# Patient Record
Sex: Female | Born: 1980 | Hispanic: No | Marital: Married | Smoking: Never smoker
Health system: Southern US, Community
[De-identification: ages and names within clinical notes are randomized; demographics above are authoritative.]

## PROBLEM LIST (undated history)

## (undated) DIAGNOSIS — B019 Varicella without complication: Secondary | ICD-10-CM

## (undated) HISTORY — DX: Varicella without complication: B01.9

## (undated) HISTORY — PX: WISDOM TOOTH EXTRACTION: SHX21

## (undated) HISTORY — PX: TONSILLECTOMY: SHX5217

---

## 2005-03-08 ENCOUNTER — Other Ambulatory Visit: Admission: RE | Admit: 2005-03-08 | Discharge: 2005-03-08 | Payer: Self-pay | Admitting: Obstetrics and Gynecology

## 2006-07-02 ENCOUNTER — Other Ambulatory Visit: Admission: RE | Admit: 2006-07-02 | Discharge: 2006-07-02 | Payer: Self-pay | Admitting: Obstetrics and Gynecology

## 2016-02-02 ENCOUNTER — Encounter: Payer: Self-pay | Admitting: Family Medicine

## 2016-02-02 ENCOUNTER — Ambulatory Visit (INDEPENDENT_AMBULATORY_CARE_PROVIDER_SITE_OTHER): Payer: BLUE CROSS/BLUE SHIELD | Admitting: Family Medicine

## 2016-02-02 VITALS — BP 116/79 | HR 74 | Temp 98.5°F | Ht <= 58 in | Wt 103.8 lb

## 2016-02-02 DIAGNOSIS — Z6379 Other stressful life events affecting family and household: Secondary | ICD-10-CM | POA: Diagnosis not present

## 2016-02-02 DIAGNOSIS — Z0000 Encounter for general adult medical examination without abnormal findings: Secondary | ICD-10-CM

## 2016-02-02 DIAGNOSIS — Z130 Encounter for screening for diseases of the blood and blood-forming organs and certain disorders involving the immune mechanism: Secondary | ICD-10-CM

## 2016-02-02 DIAGNOSIS — D509 Iron deficiency anemia, unspecified: Secondary | ICD-10-CM

## 2016-02-02 DIAGNOSIS — Z131 Encounter for screening for diabetes mellitus: Secondary | ICD-10-CM

## 2016-02-02 DIAGNOSIS — Z1329 Encounter for screening for other suspected endocrine disorder: Secondary | ICD-10-CM | POA: Diagnosis not present

## 2016-02-02 DIAGNOSIS — R6252 Short stature (child): Secondary | ICD-10-CM

## 2016-02-02 DIAGNOSIS — H9191 Unspecified hearing loss, right ear: Secondary | ICD-10-CM

## 2016-02-02 DIAGNOSIS — Z13220 Encounter for screening for lipoid disorders: Secondary | ICD-10-CM

## 2016-02-02 LAB — LIPID PANEL
CHOLESTEROL: 195 mg/dL (ref 0–200)
HDL: 36 mg/dL — ABNORMAL LOW (ref >39.00)
LDL Cholesterol: 142 mg/dL — ABNORMAL HIGH (ref 0–99)
NonHDL: 158.64
TRIGLYCERIDES: 81 mg/dL (ref 0.0–149.0)
Total CHOL/HDL Ratio: 5
VLDL: 16.2 mg/dL (ref 0.0–40.0)

## 2016-02-02 LAB — CBC
HEMATOCRIT: 33.5 % — AB (ref 36.0–46.0)
HEMOGLOBIN: 10.7 g/dL — AB (ref 12.0–15.0)
MCHC: 32 g/dL (ref 30.0–36.0)
MCV: 73.1 fl — ABNORMAL LOW (ref 78.0–100.0)
PLATELETS: 323 10*3/uL (ref 150.0–400.0)
RBC: 4.57 Mil/uL (ref 3.87–5.11)
RDW: 19.6 % — ABNORMAL HIGH (ref 11.5–15.5)
WBC: 9.4 10*3/uL (ref 4.0–10.5)

## 2016-02-02 LAB — COMPREHENSIVE METABOLIC PANEL
ALK PHOS: 43 U/L (ref 39–117)
ALT: 12 U/L (ref 0–35)
AST: 14 U/L (ref 0–37)
Albumin: 4 g/dL (ref 3.5–5.2)
BILIRUBIN TOTAL: 0.4 mg/dL (ref 0.2–1.2)
BUN: 15 mg/dL (ref 6–23)
CALCIUM: 9 mg/dL (ref 8.4–10.5)
CO2: 24 meq/L (ref 19–32)
Chloride: 106 mEq/L (ref 96–112)
Creatinine, Ser: 0.69 mg/dL (ref 0.40–1.20)
GFR: 103.89 mL/min (ref >60.00)
Glucose, Bld: 103 mg/dL — ABNORMAL HIGH (ref 70–99)
POTASSIUM: 3.8 meq/L (ref 3.5–5.1)
Sodium: 138 mEq/L (ref 135–145)
Total Protein: 7.1 g/dL (ref 6.0–8.3)

## 2016-02-02 LAB — HEMOGLOBIN A1C: Hgb A1c MFr Bld: 5.6 % (ref 4.6–6.5)

## 2016-02-02 LAB — TSH: TSH: 0.89 u[IU]/mL (ref 0.35–4.50)

## 2016-02-02 NOTE — Progress Notes (Addendum)
[facility] [address], [address] [place], [place] [address] [phone] [phone]  Date:  02/02/2016   Name:  Tara Lam   DOB:  Apr 12, 1980   MRN:  [id number]  PCP:  Abbe AmsterdamOPLAND,JESSICA, MD    Chief Complaint: Establish Care (Pt here to est care. )   History of Present Illness:  Tara Lam is a 36 y.o. very pleasant female patient who presents with the following:  Here today as a new patient to establish care  She is a pt at [facility]- sees Dr. Silvestre Gunner'Keeffe there, last seen there in march of 2017.  She has generally been in good health She has 2 children- they are 7yo girl and 375 yo boy.   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  Would like to do today  She tries to eat as well as she can, but admits that she does not really follow a healthy diet or exercise.  So far she has been able to keep a steady weight  She is fasting today- she did have a regular mountain dew this am but nothing to eat so far.  She does not drink coffee  Both of her kids are at the same school which has helped to simplify her life In her free time she enjoys watching basketball on TV and other TV shows.    She is on OCP- no other meds.  She is s/p BTL but uses the pills to regulate her menses  She separated from her husband about 8 months ago- this was her decision. 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   Past Medical History:  Diagnosis Date  . Chicken pox     Past Surgical History:   Procedure Laterality Date  . CESAREAN SECTION  04/03/2010  . TONSILLECTOMY    . WISDOM TOOTH EXTRACTION      Social History  Substance Use Topics  . Smoking status: Never Smoker  . Smokeless tobacco: Never Used  . Alcohol use Yes     Comment: social drinker    Family History  Problem Relation Age of Onset  . Cancer Maternal Grandmother   . Diabetes Maternal Grandmother   . Bladder Cancer Maternal Grandfather   . Breast cancer Paternal Grandmother   . Cancer Paternal Grandfather     No Known Allergies  Medication list has been reviewed and updated.  No current outpatient prescriptions on file prior to visit.   No current facility-administered medications on file prior to visit.     Review of Systems:  As per HPI- otherwise negative.   Physical Examination: Vitals:   02/02/16 0951  BP: 116/79  Pulse: 74  Temp: 98.5 F (36.9 C)   Vitals:   02/02/16 0951  Weight: 103 lb 12.8 oz (47.1 kg)  Height: 4\' 6"  (1.372 m)   Body mass index is 25.03 kg/m. Ideal Body Weight: Weight in (lb) to have BMI =  25: 103.5  GEN: WDWN, NAD, Non-toxic, A & O x 3, looks well,  Very small in stature HEENT: Atraumatic, Normocephalic. Neck supple. No masses, No LAD.  RIghtTM s/p graft years ago, left is WNL, oropharynx normal.  PEERL,EOMI.   Ears and Nose: No external deformity. CV: RRR, No M/G/R. No JVD. No thrill. No extra heart sounds. PULM: CTA B, no wheezes, crackles, rhonchi. No retractions. No resp. distress. No accessory muscle use. ABD: S, NT, ND EXTR: No c/c/e NEURO Normal gait.  PSYCH: Normally interactive. Conversant. Not depressed or anxious appearing.  Calm demeanor.    Assessment and Plan: Other stressful life events affecting family and household  Screening for diabetes mellitus - Plan: Comprehensive metabolic panel, Hemoglobin A1c  Screening for thyroid disorder - Plan: TSH  Screening for hyperlipidemia - Plan: Lipid panel  Screening for deficiency anemia  - Plan: CBC  Encounter for medical examination to establish care  Mild hearing loss of right ear  Small stature  Here today to establish care She would like screening labs as above Discussed her weight and healthy lifestyle See patient instructions for more details.     Signed Abbe Amsterdam, MD  1/12- received her labs, letter to pt  Results for orders placed or performed in visit on 02/02/16  CBC  Result Value Ref Range   WBC 9.4 4.0 - 10.5 K/uL   RBC 4.57 3.87 - 5.11 Mil/uL   Platelets 323.0 150.0 - 400.0 K/uL   Hemoglobin 10.7 (L) 12.0 - 15.0 g/dL   HCT 16.1 (L) 09.6 - 04.5 %   MCV 73.1 (L) 78.0 - 100.0 fl   MCHC 32.0 30.0 - 36.0 g/dL   RDW 40.9 (H) 81.1 - 91.4 %  Comprehensive metabolic panel  Result Value Ref Range   Sodium 138 135 - 145 mEq/L   Potassium 3.8 3.5 - 5.1 mEq/L   Chloride 106 96 - 112 mEq/L   CO2 24 19 - 32 mEq/L   Glucose, Bld 103 (H) 70 - 99 mg/dL   BUN 15 6 - 23 mg/dL   Creatinine, Ser 7.82 0.40 - 1.20 mg/dL   Total Bilirubin 0.4 0.2 - 1.2 mg/dL   Alkaline Phosphatase 43 39 - 117 U/L   AST 14 0 - 37 U/L   ALT 12 0 - 35 U/L   Total Protein 7.1 6.0 - 8.3 g/dL   Albumin 4.0 3.5 - 5.2 g/dL   Calcium 9.0 8.4 - 95.6 mg/dL   GFR 213.08 >65.78 mL/min  Hemoglobin A1c  Result Value Ref Range   Hgb A1c MFr Bld 5.6 4.6 - 6.5 %  TSH  Result Value Ref Range   TSH 0.89 0.35 - 4.50 uIU/mL  Lipid panel  Result Value Ref Range   Cholesterol 195 0 - 200 mg/dL   Triglycerides 46.9 0.0 - 149.0 mg/dL   HDL 62.95 (L) >28.41 mg/dL   VLDL 32.4 0.0 - 40.1 mg/dL   LDL Cholesterol 027 (H) 0 - 99 mg/dL   Total CHOL/HDL Ratio 5    NonHDL 158.64    Your labs are overall good.  Thyroid and metabolic profile are normal.  Your A1c shows that you do not have pre-diabetes.  However, your cholesterol is not as good as we would like for someone your age.  As we discussed, work on a better diet (less sugared sodas!) and try to get exercise when you can.  We can repeat  these labs in one year.   Finally, you are a  bit anemic.  Your blood count suggests that this may be due to low iron (can also be related to menstrual blood losses.    Please work on increasing your iron intake through diet, a supplement and/ or cooking in a cast iron pan.  I will place a lab order for you; please come in for a lab visit only and recheck these levels in 2-3 months.

## 2016-02-02 NOTE — Patient Instructions (Signed)
It was a pleasure to see you today- I will be in touch with your labs asap Assuming that your labs look ok we can see you again in a year.  I am also glad to do your pap smears in the future if you prefer As we discussed, your current weight is fine but you may find it harder to maintain as the years go by- it would be smart to work on establishing an exercise program now so you can keep your weight steady in the future.   If you do end up feeling overwhelmed by recent life stressors don't hesitate to contact me.  Exercise is also a great way to deal with stress!

## 2016-02-02 NOTE — Progress Notes (Signed)
Pre visit review using our clinic review tool, if applicable. No additional management support is needed unless otherwise documented below in the visit note. 

## 2016-02-03 ENCOUNTER — Encounter: Payer: Self-pay | Admitting: Family Medicine

## 2016-02-03 NOTE — Addendum Note (Signed)
Addended by: Abbe AmsterdamOPLAND, Laresha Bacorn C on: 02/03/2016 04:53 PM   Modules accepted: Orders

## 2016-09-14 ENCOUNTER — Other Ambulatory Visit: Payer: Self-pay | Admitting: Family Medicine

## 2016-09-14 NOTE — Telephone Encounter (Addendum)
Relation to LY:YTKP Call back number:[phone] Pharmacy: [id number] - [place], [place] - [address] [phone] ([phone] (Fax)     Reason for call:  Patient requesting Norethindrone Acetate-Ethinyl Estrad-FE (BLISOVI 24 FE) 1-20 MG-MCG(24) tablet refill

## 2016-09-19 MED ORDER — NORETHIN ACE-ETH ESTRAD-FE 1-20 MG-MCG(24) PO TABS: ORAL_TABLET | ORAL | 4 refills | Status: DC

## 2016-11-27 ENCOUNTER — Encounter: Payer: Self-pay | Admitting: Family Medicine

## 2016-11-27 ENCOUNTER — Ambulatory Visit (INDEPENDENT_AMBULATORY_CARE_PROVIDER_SITE_OTHER): Payer: BLUE CROSS/BLUE SHIELD | Admitting: Family Medicine

## 2016-11-27 DIAGNOSIS — J0110 Acute frontal sinusitis, unspecified: Secondary | ICD-10-CM | POA: Diagnosis not present

## 2016-11-27 MED ORDER — DOXYCYCLINE HYCLATE 100 MG PO TABS: 100.0000 mg | ORAL_TABLET | Freq: Two times a day (BID) | ORAL | 0 refills | Status: DC

## 2016-11-27 NOTE — Patient Instructions (Addendum)
Encouraged increased rest and hydration, add probiotics (Digestive Advantage, [employer] colon health, [employer], [employer] company), zinc such as Coldeze or Xicam. Treat fevers as needed. Elderberry liquid (sambucol), Vitamin C 500 to 1000 mg daily. Aged or Black garlic Sinusitis, Adult Sinusitis is soreness and inflammation of your sinuses. Sinuses are hollow spaces in the bones around your face. They are located:  Around your eyes.  In the middle of your forehead.  Behind your nose.  In your cheekbones.  Your sinuses and nasal passages are lined with a stringy fluid (mucus). Mucus normally drains out of your sinuses. When your nasal tissues get inflamed or swollen, the mucus can get trapped or blocked so air cannot flow through your sinuses. This lets bacteria, viruses, and funguses grow, and that leads to infection. Follow these instructions at home: Medicines  Take, use, or apply over-the-counter and prescription medicines only as told by your doctor. These may include nasal sprays.  If you were prescribed an antibiotic medicine, take it as told by your doctor. Do not stop taking the antibiotic even if you start to feel better. Hydrate and Humidify  Drink enough water to keep your pee (urine) clear or pale yellow.  Use a cool mist humidifier to keep the humidity level in your home above 50%.  Breathe in steam for 10-15 minutes, 3-4 times a day or as told by your doctor. You can do this in the bathroom while a hot shower is running.  Try not to spend time in cool or dry air. Rest  Rest as much as possible.  Sleep with your head raised (elevated).  Make sure to get enough sleep each night. General instructions  Put a warm, moist washcloth on your face 3-4 times a day or as told by your doctor. This will help with discomfort.  Wash your hands often with soap and water. If there is no soap and water, use hand sanitizer.  Do not smoke. Avoid being around people who are smoking  (secondhand smoke).  Keep all follow-up visits as told by your doctor. This is important. Contact a doctor if:  You have a fever.  Your symptoms get worse.  Your symptoms do not get better within 10 days. Get help right away if:  You have a very bad headache.  You cannot stop throwing up (vomiting).  You have pain or swelling around your face or eyes.  You have trouble seeing.  You feel confused.  Your neck is stiff.  You have trouble breathing. This information is not intended to replace advice given to you by your health care provider. Make sure you discuss any questions you have with your health care provider. Document Released: 06/27/2007 Document Revised: 09/04/2015 Document Reviewed: 11/03/2014 Elsevier Interactive Patient Education  [employer]2018 Elsevier Inc.

## 2016-11-27 NOTE — Progress Notes (Signed)
Subjective:  I acted as a Neurosurgeonscribe for Textron IncDr.Annsleigh Dragoo. Fuller SongShaneka, RMA   Patient ID: Tara Lam, female    DOB: [birth year], 36 y.o.   MRN: [id number]  Chief Complaint  Patient presents with  . Facial Pain    HPI  Patient is in today for sinus pain. She has been struggling with sinus pressure, PND, cough, malaise for past 2 days. No fevers, chills but malaise and myalgias are noted. Has not tried any OTC meds. Has had some yellow and clear rhinorrhea. Denies CP/palp/SOB/HA/fevers/GI or GU c/o. Taking meds as prescribed  Patient Care Team: Copland, Gwenlyn FoundJessica C, MD as PCP - General (Family Medicine)   Past Medical History:  Diagnosis Date  . Chicken pox     Past Surgical History:  Procedure Laterality Date  . CESAREAN SECTION  04/03/2010  . TONSILLECTOMY    . WISDOM TOOTH EXTRACTION      Family History  Problem Relation Age of Onset  . Cancer Maternal Grandmother   . Diabetes Maternal Grandmother   . Bladder Cancer Maternal Grandfather   . Breast cancer Paternal Grandmother   . Cancer Paternal Grandfather     Social History   Socioeconomic History  . Marital status: Legally Separated    Spouse name: Not on file  . Number of children: Not on file  . Years of education: Not on file  . Highest education level: Not on file  Social Needs  . Financial resource strain: Not on file  . Food insecurity - worry: Not on file  . Food insecurity - inability: Not on file  . Transportation needs - medical: Not on file  . Transportation needs - non-medical: Not on file  Occupational History  . Not on file  Tobacco Use  . Smoking status: Never Smoker  . Smokeless tobacco: Never Used  Substance and Sexual Activity  . Alcohol use: Yes    Comment: social drinker  . Drug use: No  . Sexual activity: Not on file  Other Topics Concern  . Not on file  Social History Narrative  . Not on file    Outpatient Medications Prior to Visit  Medication Sig Dispense Refill  . Norethindrone  Acetate-Ethinyl Estrad-FE (BLISOVI 24 FE) 1-20 MG-MCG(24) tablet TAKE 1 TABLET EVERY DAY *SKIP PLACEBO 3 Package 4   No facility-administered medications prior to visit.     No Known Allergies  Review of Systems  Constitutional: Negative for fever and malaise/fatigue.  HENT: Positive for congestion, sinus pain and sore throat.   Eyes: Negative for blurred vision.  Respiratory: Positive for cough and sputum production. Negative for shortness of breath.   Cardiovascular: Negative for chest pain, palpitations and leg swelling.  Gastrointestinal: Negative for abdominal pain, blood in stool and nausea.  Genitourinary: Negative for dysuria and frequency.  Musculoskeletal: Negative for falls.  Skin: Negative for rash.  Neurological: Negative for dizziness, loss of consciousness and headaches.  Endo/Heme/Allergies: Negative for environmental allergies.  Psychiatric/Behavioral: Negative for depression. The patient is not nervous/anxious.        Objective:    Physical Exam  Constitutional: She is oriented to person, place, and time. She appears well-developed and well-nourished. No distress.  HENT:  Head: Normocephalic and atraumatic.  Nose: Nose normal.  Nasal mucosa boggy and erythematous  Eyes: Right eye exhibits no discharge. Left eye exhibits no discharge.  Neck: Normal range of motion. Neck supple.  Cardiovascular: Normal rate and regular rhythm.  No murmur heard. Pulmonary/Chest: Effort normal and breath sounds normal.  Abdominal: Soft. Bowel sounds are normal. There is no tenderness.  Musculoskeletal: She exhibits no edema.  Neurological: She is alert and oriented to person, place, and time.  Skin: Skin is warm and dry.  Psychiatric: She has a normal mood and affect.  Nursing note and vitals reviewed.   BP 118/62 (BP Location: Left Arm, Patient Position: Sitting, Cuff Size: Small)   Pulse 93   Temp 98.1 F (36.7 C) (Oral)   Resp 18   Wt 116 lb (52.6 kg)   SpO2 98%    BMI 27.97 kg/m  Wt Readings from Last 3 Encounters:  11/27/16 116 lb (52.6 kg)  02/02/16 103 lb 12.8 oz (47.1 kg)   BP Readings from Last 3 Encounters:  11/27/16 118/62  02/02/16 116/79      There is no immunization history on file for this patient.  Health Maintenance  Topic Date Due  . HIV Screening  09/12/1997  . TETANUS/TDAP  09/12/2001  . INFLUENZA VACCINE  08/22/2016  . PAP SMEAR  03/23/2018    Lab Results  Component Value Date   WBC 9.4 02/02/2016   HGB 10.7 (L) 02/02/2016   HCT 33.5 (L) 02/02/2016   PLT 323.0 02/02/2016   GLUCOSE 103 (H) 02/02/2016   CHOL 195 02/02/2016   TRIG 81.0 02/02/2016   HDL 36.00 (L) 02/02/2016   LDLCALC 142 (H) 02/02/2016   ALT 12 02/02/2016   AST 14 02/02/2016   NA 138 02/02/2016   K 3.8 02/02/2016   CL 106 02/02/2016   CREATININE 0.69 02/02/2016   BUN 15 02/02/2016   CO2 24 02/02/2016   TSH 0.89 02/02/2016   HGBA1C 5.6 02/02/2016    Lab Results  Component Value Date   TSH 0.89 02/02/2016   Lab Results  Component Value Date   WBC 9.4 02/02/2016   HGB 10.7 (L) 02/02/2016   HCT 33.5 (L) 02/02/2016   MCV 73.1 (L) 02/02/2016   PLT 323.0 02/02/2016   Lab Results  Component Value Date   NA 138 02/02/2016   K 3.8 02/02/2016   CO2 24 02/02/2016   GLUCOSE 103 (H) 02/02/2016   BUN 15 02/02/2016   CREATININE 0.69 02/02/2016   BILITOT 0.4 02/02/2016   ALKPHOS 43 02/02/2016   AST 14 02/02/2016   ALT 12 02/02/2016   PROT 7.1 02/02/2016   ALBUMIN 4.0 02/02/2016   CALCIUM 9.0 02/02/2016   GFR 103.89 02/02/2016   Lab Results  Component Value Date   CHOL 195 02/02/2016   Lab Results  Component Value Date   HDL 36.00 (L) 02/02/2016   Lab Results  Component Value Date   LDLCALC 142 (H) 02/02/2016   Lab Results  Component Value Date   TRIG 81.0 02/02/2016   Lab Results  Component Value Date   CHOLHDL 5 02/02/2016   Lab Results  Component Value Date   HGBA1C 5.6 02/02/2016         Assessment & Plan:    Problem List Items Addressed This Visit    Sinusitis    Encouraged increased rest and hydration, add probiotics, zinc such as Coldeze or Xicam. Treat fevers as needed, elderberry and Vitamin C. If worsens given Doxycycline to use      Relevant Medications   doxycycline (VIBRA-TABS) 100 MG tablet      I am having Tara Lam start on doxycycline. I am also having her maintain her Norethindrone Acetate-Ethinyl Estrad-FE.  Meds ordered this encounter  Medications  . doxycycline (VIBRA-TABS) 100 MG tablet  Sig: Take 1 tablet (100 mg total) 2 (two) times daily by mouth.    Dispense:  20 tablet    Refill:  0    CMA served as scribe during this visit. History, Physical and Plan performed by medical provider. Documentation and orders reviewed and attested to.  Danise Edge, MD

## 2016-11-28 DIAGNOSIS — J329 Chronic sinusitis, unspecified: Secondary | ICD-10-CM | POA: Insufficient documentation

## 2016-11-28 NOTE — Assessment & Plan Note (Signed)
Encouraged increased rest and hydration, add probiotics, zinc such as Coldeze or Xicam. Treat fevers as needed, elderberry and Vitamin C. If worsens given Doxycycline to use

## 2017-01-25 NOTE — Progress Notes (Addendum)
[facility] [address] [place], [place] [address] [phone]  Date:  01/28/2017   Name:  Tara Lam   DOB:  [birth year]   MRN:  [id number]  PCP:  Darreld Mclean, MD    Chief Complaint: Annual Exam   History of Present Illness:  Tara Lam is a 37 y.o. very pleasant female patient who presents with the following:  Here today for a CPE Fasting for labs today Last visit with me about one year ago:  She is a pt at [facility]- sees Dr. Edwyna Ready there, last seen there in march of 2017.  She has generally been in good health She has 2 children- they are 93yo girl and 59 yo boy.   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  Would like to do today  She tries to eat as well as she can, but admits that she does not really follow a healthy diet or exercise.  So far she has been able to keep a steady weight  She is fasting today- she did have a regular mountain dew this am but nothing to eat so far.  She does not drink coffee She is on OCP- no other meds.  She is s/p BTL but uses the pills to regulate her menses  She separated from her husband about 8 months ago- this was her decision. 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  Thyroid and metabolic profile are normal.  Your A1c shows that you do not have pre-diabetes.  However, your cholesterol is not as good as we would like for someone your age.  As we discussed, work on a better diet (less sugared sodas!) and  try to get exercise when you can.  We can repeat these labs in one year.   Finally, you are a bit anemic.  Your blood count suggests that this may be due to low iron (can also be related to menstrual blood losses.    Please work on increasing your iron intake through diet, a supplement and/ or cooking in a cast iron pan.  I will place a lab order for you; please come in for a lab visit only and recheck these levels in 2-3 months.    Pap: 3/17 Flu: today Tetanus: when she was a child  Labs: due today  She has been working on cutting down on mountain dew- she is also trying to work on her diet and trying to get more exercise.  However she notes that this is not reflected in her weight unfortunatly-  She has gained a few lbs- about 12- over the last year or so She is not aware of any major changes in her diet except for the improvements she is trying to make.  However, she is under a lot less stress since her divorce is final- she is skipping fewer meals She has completed her divorce- this is a relief  to be done with the process There is a family history of breast cancer, but she has been tested for (we think) BRCA and was negative.   Discussed getting a baseline mammo at age 37 if she would like   Wt Readings from Last 3 Encounters:  01/28/17 117 lb 6.4 oz (53.3 kg)  11/27/16 116 lb (52.6 kg)  02/02/16 103 lb 12.8 oz (47.1 kg)    Patient Active Problem List   Diagnosis Date Noted  . Sinusitis 11/28/2016  . 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 Small stature 02/02/2016    Past Medical History:  Diagnosis Date  . Chicken pox     Past Surgical History:  Procedure Laterality Date  . CESAREAN SECTION  04/03/2010  . TONSILLECTOMY    . WISDOM TOOTH EXTRACTION      Social History   Tobacco Use  . Smoking status: Never Smoker  . Smokeless tobacco: Never Used  Substance Use Topics  . Alcohol use: Yes    Comment: social drinker  . Drug use: No    Family History   Problem Relation Age of Onset  . Cancer Maternal Grandmother   . Diabetes Maternal Grandmother   . Bladder Cancer Maternal Grandfather   . Breast cancer Paternal Grandmother   . Cancer Paternal Grandfather     No Known Allergies  Medication list has been reviewed and updated.  Current Outpatient Medications on File Prior to Visit  Medication Sig Dispense Refill  . doxycycline (VIBRA-TABS) 100 MG tablet Take 1 tablet (100 mg total) 2 (two) times daily by mouth. 20 tablet 0  . Norethindrone Acetate-Ethinyl Estrad-FE (BLISOVI 24 FE) 1-20 MG-MCG(24) tablet TAKE 1 TABLET EVERY DAY *SKIP PLACEBO 3 Package 4   No current facility-administered medications on file prior to visit.     Review of Systems:  As per HPI- otherwise negative. She is working on walking on a treadmill for exercise She denies any breast changes or concerns. No skin changes or concerns    Physical Examination: Vitals:   01/28/17 0823  BP: 128/70  Pulse: 98  Resp: 16  Temp: 98.3 F (36.8 C)  SpO2: 98%   Vitals:   01/28/17 0823  Weight: 117 lb 6.4 oz (53.3 kg)  Height: 4' 7" (1.397 m)   Body mass index is 27.29 kg/m. Ideal Body Weight: Weight in (lb) to have BMI = 25: 107.3  GEN: WDWN, NAD, Non-toxic, A & O x 3, petite build, overweight HEENT: Atraumatic, Normocephalic. Neck supple. No masses, No LAD. Bilateral TM wnl, oropharynx normal.  PEERL,EOMI.   Ears and Nose: No external deformity. CV: RRR, No M/G/R. No JVD. No thrill. No extra heart sounds. PULM: CTA B, no wheezes, crackles, rhonchi. No retractions. No resp. distress. No accessory muscle use. ABD: S, NT, ND. No rebound. No HSM.  Benign belly EXTR: No c/c/e NEURO Normal gait.  PSYCH: Normally interactive. Conversant. Not depressed or anxious appearing.  Calm demeanor.    Assessment and Plan: Physical exam  Screening for diabetes mellitus - Plan: Comprehensive metabolic panel, Hemoglobin A1c  Screening for hyperlipidemia - Plan: Lipid  panel  Screening for deficiency anemia - Plan: CBC  Needs flu shot - Plan: Flu Vaccine QUAD 6+ mos PF IM (Fluarix Quad PF)  Weight gain - Plan: TSH  Immunization due - Plan: Tdap vaccine greater than or equal to 7yo IM  CPE today She has gained a little weight- discussed with her.  Labs pending.  Encouraged weight training   to build muscle mass and perhaps use of a phone app such as myfitness pal  Flu and tdap today Will plan further follow- up pending labs.   Signed Jessica Copland, MD  Received her labs - message to pt  Your blood count and metabolic profile look ok A1c (average blood sugar) is normal, and your thyroid is also normal Although your total cholesterol is not very high, your total to HDL (good cholesterol) ratio is not as good as I would like to see.  Please add an omega 3 supplement to your routine, and continue to work on diet, exercise and losing a few lbs as we discussed.   We can plan to recheck in one year Take care! Results for orders placed or performed in visit on 01/28/17  CBC  Result Value Ref Range   WBC 9.5 4.0 - 10.5 K/uL   RBC 4.61 3.87 - 5.11 Mil/uL   Platelets 295.0 150.0 - 400.0 K/uL   Hemoglobin 13.1 12.0 - 15.0 g/dL   HCT 39.9 36.0 - 46.0 %   MCV 86.6 78.0 - 100.0 fl   MCHC 32.9 30.0 - 36.0 g/dL   RDW 14.2 11.5 - 15.5 %  Comprehensive metabolic panel  Result Value Ref Range   Sodium 139 135 - 145 mEq/L   Potassium 3.8 3.5 - 5.1 mEq/L   Chloride 109 96 - 112 mEq/L   CO2 23 19 - 32 mEq/L   Glucose, Bld 103 (H) 70 - 99 mg/dL   BUN 17 6 - 23 mg/dL   Creatinine, Ser 0.72 0.40 - 1.20 mg/dL   Total Bilirubin 0.4 0.2 - 1.2 mg/dL   Alkaline Phosphatase 52 39 - 117 U/L   AST 13 0 - 37 U/L   ALT 15 0 - 35 U/L   Total Protein 7.1 6.0 - 8.3 g/dL   Albumin 4.1 3.5 - 5.2 g/dL   Calcium 9.0 8.4 - 10.5 mg/dL   GFR 98.33 >60.00 mL/min  Hemoglobin A1c  Result Value Ref Range   Hgb A1c MFr Bld 5.5 4.6 - 6.5 %  Lipid panel  Result Value Ref Range    Cholesterol 204 (H) 0 - 200 mg/dL   Triglycerides 79.0 0.0 - 149.0 mg/dL   HDL 34.30 (L) >39.00 mg/dL   VLDL 15.8 0.0 - 40.0 mg/dL   LDL Cholesterol 154 (H) 0 - 99 mg/dL   Total CHOL/HDL Ratio 6    NonHDL 169.82   TSH  Result Value Ref Range   TSH 2.39 0.35 - 4.50 uIU/mL   Message to pt  

## 2017-01-28 ENCOUNTER — Encounter: Payer: Self-pay | Admitting: Family Medicine

## 2017-01-28 ENCOUNTER — Ambulatory Visit (INDEPENDENT_AMBULATORY_CARE_PROVIDER_SITE_OTHER): Payer: BLUE CROSS/BLUE SHIELD | Admitting: Family Medicine

## 2017-01-28 VITALS — BP 128/70 | HR 98 | Temp 98.3°F | Resp 16 | Ht <= 58 in | Wt 117.4 lb

## 2017-01-28 DIAGNOSIS — Z130 Encounter for screening for diseases of the blood and blood-forming organs and certain disorders involving the immune mechanism: Secondary | ICD-10-CM

## 2017-01-28 DIAGNOSIS — Z13220 Encounter for screening for lipoid disorders: Secondary | ICD-10-CM

## 2017-01-28 DIAGNOSIS — Z23 Encounter for immunization: Secondary | ICD-10-CM

## 2017-01-28 DIAGNOSIS — Z131 Encounter for screening for diabetes mellitus: Secondary | ICD-10-CM | POA: Diagnosis not present

## 2017-01-28 DIAGNOSIS — Z0000 Encounter for general adult medical examination without abnormal findings: Secondary | ICD-10-CM | POA: Diagnosis not present

## 2017-01-28 DIAGNOSIS — R635 Abnormal weight gain: Secondary | ICD-10-CM | POA: Diagnosis not present

## 2017-01-28 LAB — COMPREHENSIVE METABOLIC PANEL
ALBUMIN: 4.1 g/dL (ref 3.5–5.2)
ALK PHOS: 52 U/L (ref 39–117)
ALT: 15 U/L (ref 0–35)
AST: 13 U/L (ref 0–37)
BILIRUBIN TOTAL: 0.4 mg/dL (ref 0.2–1.2)
BUN: 17 mg/dL (ref 6–23)
CALCIUM: 9 mg/dL (ref 8.4–10.5)
CO2: 23 meq/L (ref 19–32)
CREATININE: 0.72 mg/dL (ref 0.40–1.20)
Chloride: 109 mEq/L (ref 96–112)
GFR: 98.33 mL/min (ref >60.00)
Glucose, Bld: 103 mg/dL — ABNORMAL HIGH (ref 70–99)
Potassium: 3.8 mEq/L (ref 3.5–5.1)
Sodium: 139 mEq/L (ref 135–145)
Total Protein: 7.1 g/dL (ref 6.0–8.3)

## 2017-01-28 LAB — CBC
HCT: 39.9 % (ref 36.0–46.0)
HEMOGLOBIN: 13.1 g/dL (ref 12.0–15.0)
MCHC: 32.9 g/dL (ref 30.0–36.0)
MCV: 86.6 fl (ref 78.0–100.0)
PLATELETS: 295 10*3/uL (ref 150.0–400.0)
RBC: 4.61 Mil/uL (ref 3.87–5.11)
RDW: 14.2 % (ref 11.5–15.5)
WBC: 9.5 10*3/uL (ref 4.0–10.5)

## 2017-01-28 LAB — LIPID PANEL
CHOLESTEROL: 204 mg/dL — AB (ref 0–200)
HDL: 34.3 mg/dL — ABNORMAL LOW (ref >39.00)
LDL Cholesterol: 154 mg/dL — ABNORMAL HIGH (ref 0–99)
NonHDL: 169.82
TRIGLYCERIDES: 79 mg/dL (ref 0.0–149.0)
Total CHOL/HDL Ratio: 6
VLDL: 15.8 mg/dL (ref 0.0–40.0)

## 2017-01-28 LAB — HEMOGLOBIN A1C: HEMOGLOBIN A1C: 5.5 % (ref 4.6–6.5)

## 2017-01-28 LAB — TSH: TSH: 2.39 u[IU]/mL (ref 0.35–4.50)

## 2017-01-28 NOTE — Patient Instructions (Signed)
It was a pleasure to see you again today!   Take care and I will be in touch with your labs asap  You got your flu shot and Tdap (tetanus with whooping cough booster) today You have gained a few lbs as you noticed- I would recommend that you consider some weight training to help build muscle. Also, using a free app like "my fitness pal" can be helpful in guiding you to achieve gradual weight loss

## 2017-10-14 ENCOUNTER — Other Ambulatory Visit: Payer: Self-pay | Admitting: Family Medicine

## 2017-10-14 ENCOUNTER — Encounter: Payer: Self-pay | Admitting: Family Medicine

## 2017-10-15 MED ORDER — NORETHIN ACE-ETH ESTRAD-FE 1-20 MG-MCG(24) PO TABS: ORAL_TABLET | ORAL | 4 refills | Status: DC

## 2018-03-01 NOTE — Progress Notes (Addendum)
[facility] [place] [address], [address] [place], [place] [address] [phone] [phone]  Date:  03/03/2018   Name:  Tara Lam   DOB:  [birth year]   MRN:  [id number]  PCP:  Tara Lam, Iriana Artley C, MD    Chief Complaint: Annual Exam   History of Present Illness:  Tara Lam is a 38 y.o. very pleasant female patient who presents with the following:  Here today for recheck visit I last saw her for physical exam about 1 year ago-at that time she was recently divorced, and was relieved to be through this process She has 2 children, a daughter and a son; 609 and 867 yo, they just moved into a new home.  She is getting re-married to a man who has a 36-year-old and 36 year old.  She is excited about her upcoming wedding She is employed at [employer]  Immunizations: flu today  Labs: Done a year ago, would like to repeat today as her lipids were not ideal, she is fasting today She is a patient at [facility] but has not been going there in a few years  Her last pap was about 3 years ago.  Would like to repeat today She is status-post BTL but uses birth control pills to manage her cycles  She notes a family history of breast cancer, and had a concern of a mass in her early 6230s.  She had a mammogram at that time, and also had genetic testing which was normal. Wt Readings from Last 3 Encounters:  03/03/18 118 lb (53.5 kg)  01/28/17 117 lb 6.4 oz (53.3 kg)  11/27/16 116 lb (52.6 kg)     Patient Active Problem List   Diagnosis Date Noted  . 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 Small stature 02/02/2016    Past Medical History:  Diagnosis Date  . Chicken pox     Past Surgical History:  Procedure Laterality Date  . CESAREAN SECTION  04/03/2010  . TONSILLECTOMY    . WISDOM TOOTH EXTRACTION      Social History   Tobacco Use  . Smoking status: Never Smoker  . Smokeless tobacco: Never Used  Substance Use Topics  . Alcohol use:  Yes    Comment: social drinker  . Drug use: No    Family History  Problem Relation Age of Onset  . Cancer Maternal Grandmother   . Diabetes Maternal Grandmother   . Bladder Cancer Maternal Grandfather   . Breast cancer Paternal Grandmother   . Cancer Paternal Grandfather     No Known Allergies  Medication list has been reviewed and updated.  Current Outpatient Medications on File Prior to Visit  Medication Sig Dispense Refill  . Norethindrone Acetate-Ethinyl Estrad-FE (BLISOVI 24 FE) 1-20 MG-MCG(24) tablet TAKE 1 TABLET EVERY DAY *SKIP PLACEBO 3 Package 4   No current facility-administered medications on file prior to visit.     Review of Systems:  As per HPI- otherwise negative. She is a never smoker, drinks alcohol occasionally She is exercising using an electronic boxing program Trying hard to follow a clean diet  Physical Examination: Vitals:   03/03/18 0840  BP: 124/80  Pulse: 64  Resp: 16  Temp: 98 F (36.7 C)  SpO2: 100%   Vitals:   03/03/18 0840  Weight: 118 lb (53.5 kg)  Height: 4\' 7"  (1.397 m)   Body mass index is 27.43 kg/m. Ideal Body Weight: Weight in (lb) to have  BMI = 25: 107.3  GEN: WDWN, NAD, Non-toxic, A & O x 3, petite build/ short stature  HEENT: Atraumatic, Normocephalic. Neck supple. No masses, No LAD.  Bilateral TM wnl, oropharynx normal.  PEERL,EOMI.   Ears and Nose: No external deformity. CV: RRR, No M/G/R. No JVD. No thrill. No extra heart sounds. PULM: CTA B, no wheezes, crackles, rhonchi. No retractions. No resp. distress. No accessory muscle use. ABD: S, NT, ND. No rebound. No HSM. EXTR: No c/c/e NEURO Normal gait.  PSYCH: Normally interactive. Conversant. Not depressed or anxious appearing.  Calm demeanor.  Breast: normal exam except for a pea-sized mobile mass in the left breast, at approximately 1:00 in relationship to the nipple.  No dimpling/ discharge Pelvic: normal, no vaginal lesions or discharge. Uterus normal, no CMT,  no adnexal tendereness or masses   Assessment and Plan: Physical exam  Mass of left breast - Plan: MS DIGITAL DIAG TOMO BILAT, US BREAST COMPLETE UNI LEFT INC AXILLA  Screening for diabetes mellitus - Plan: Comprehensive metabolic panel, Hemoglobin A1c  Screening for hyperlipidemia - Plan: Lipid panel  Screening for deficiency anemia - Plan: CBC  Screening for cervical cancer - Plan: Cytology - PAP  Overweight  CPE today Discussed her weight- she is trying to keep her weight down but it is difficult as she is so very petite.  I advised her to concentrate on health and fitness, and not eating a healthy diet.  She will try to keep her weight where it is I ordered a diagnostic mammogram and ultrasound for today, we will set this up at [facility].  She is instructed to let us know if she does not hear about this Flu shot given today Pap pending Blood work pending  Signed Abbe Amsterdam, MD  She has a form that needs to be completed once her labs come in  Received her labs-message to patient Results for orders placed or performed in visit on 03/03/18  CBC  Result Value Ref Range   WBC 8.3 4.0 - 10.5 K/uL   RBC 4.81 3.87 - 5.11 Mil/uL   Platelets 294.0 150.0 - 400.0 K/uL   Hemoglobin 13.7 12.0 - 15.0 g/dL   HCT 36.4 68.0 - 32.1 %   MCV 86.7 78.0 - 100.0 fl   MCHC 32.8 30.0 - 36.0 g/dL   RDW 22.4 82.5 - 00.3 %  Comprehensive metabolic panel  Result Value Ref Range   Sodium 143 135 - 145 mEq/L   Potassium 4.7 3.5 - 5.1 mEq/L   Chloride 109 96 - 112 mEq/L   CO2 26 19 - 32 mEq/L   Glucose, Bld 91 70 - 99 mg/dL   BUN 18 6 - 23 mg/dL   Creatinine, Ser 7.04 0.40 - 1.20 mg/dL   Total Bilirubin 0.4 0.2 - 1.2 mg/dL   Alkaline Phosphatase 58 39 - 117 U/L   AST 12 0 - 37 U/L   ALT 17 0 - 35 U/L   Total Protein 6.9 6.0 - 8.3 g/dL   Albumin 4.3 3.5 - 5.2 g/dL   Calcium 9.5 8.4 - 88.8 mg/dL   GFR 91.69 >45.03 mL/min  Hemoglobin A1c  Result Value Ref Range   Hgb A1c MFr  Bld 5.4 4.6 - 6.5 %  Lipid panel  Result Value Ref Range   Cholesterol 239 (H) 0 - 200 mg/dL   Triglycerides 888.2 0.0 - 149.0 mg/dL   HDL 80.03 (L) >49.17 mg/dL   VLDL 91.5 0.0 - 05.6 mg/dL  LDL Cholesterol 185 (H) 0 - 99 mg/dL   Total CHOL/HDL Ratio 7    NonHDL 206.27    Also received her pap 2/16, message to pt  Adequacy Satisfactory for evaluation endocervical/transformation zone component PRESENT.Abnormal    Diagnosis - ATYPICAL SQUAMOUS CELLS OF UNDETERMINED SIGNIFICANCE (ASC-US)Abnormal    HPV NOT DETECTED   Comment: Normal Reference Range - NOT Detected  Material Submitted CervicoVaginal Pap [ThinPrep Imaged]Abnormal

## 2018-03-03 ENCOUNTER — Ambulatory Visit (INDEPENDENT_AMBULATORY_CARE_PROVIDER_SITE_OTHER): Payer: BLUE CROSS/BLUE SHIELD | Admitting: Family Medicine

## 2018-03-03 ENCOUNTER — Encounter: Payer: Self-pay | Admitting: Family Medicine

## 2018-03-03 ENCOUNTER — Other Ambulatory Visit (HOSPITAL_COMMUNITY)
Admission: RE | Admit: 2018-03-03 | Discharge: 2018-03-03 | Disposition: A | Discharge status: Home / Self Care | Payer: BLUE CROSS/BLUE SHIELD | Source: Ambulatory Visit | Attending: Family Medicine | Admitting: Family Medicine

## 2018-03-03 VITALS — BP 124/80 | HR 64 | Temp 98.0°F | Resp 16 | Ht <= 58 in | Wt 118.0 lb

## 2018-03-03 DIAGNOSIS — Z131 Encounter for screening for diabetes mellitus: Secondary | ICD-10-CM

## 2018-03-03 DIAGNOSIS — Z13220 Encounter for screening for lipoid disorders: Secondary | ICD-10-CM

## 2018-03-03 DIAGNOSIS — N6320 Unspecified lump in the left breast, unspecified quadrant: Secondary | ICD-10-CM | POA: Diagnosis not present

## 2018-03-03 DIAGNOSIS — Z130 Encounter for screening for diseases of the blood and blood-forming organs and certain disorders involving the immune mechanism: Secondary | ICD-10-CM

## 2018-03-03 DIAGNOSIS — Z0000 Encounter for general adult medical examination without abnormal findings: Secondary | ICD-10-CM | POA: Diagnosis not present

## 2018-03-03 DIAGNOSIS — E663 Overweight: Secondary | ICD-10-CM

## 2018-03-03 DIAGNOSIS — Z124 Encounter for screening for malignant neoplasm of cervix: Secondary | ICD-10-CM | POA: Diagnosis not present

## 2018-03-03 DIAGNOSIS — Z23 Encounter for immunization: Secondary | ICD-10-CM | POA: Diagnosis not present

## 2018-03-03 LAB — LIPID PANEL
Cholesterol: 239 mg/dL — ABNORMAL HIGH (ref 0–200)
HDL: 32.8 mg/dL — ABNORMAL LOW (ref >39.00)
LDL Cholesterol: 185 mg/dL — ABNORMAL HIGH (ref 0–99)
NonHDL: 206.27
Total CHOL/HDL Ratio: 7
Triglycerides: 107 mg/dL (ref 0.0–149.0)
VLDL: 21.4 mg/dL (ref 0.0–40.0)

## 2018-03-03 LAB — COMPREHENSIVE METABOLIC PANEL
ALT: 17 U/L (ref 0–35)
AST: 12 U/L (ref 0–37)
Albumin: 4.3 g/dL (ref 3.5–5.2)
Alkaline Phosphatase: 58 U/L (ref 39–117)
BUN: 18 mg/dL (ref 6–23)
CALCIUM: 9.5 mg/dL (ref 8.4–10.5)
CHLORIDE: 109 meq/L (ref 96–112)
CO2: 26 meq/L (ref 19–32)
CREATININE: 0.72 mg/dL (ref 0.40–1.20)
GFR: 91.93 mL/min (ref >60.00)
Glucose, Bld: 91 mg/dL (ref 70–99)
POTASSIUM: 4.7 meq/L (ref 3.5–5.1)
Sodium: 143 mEq/L (ref 135–145)
Total Bilirubin: 0.4 mg/dL (ref 0.2–1.2)
Total Protein: 6.9 g/dL (ref 6.0–8.3)

## 2018-03-03 LAB — CBC
HEMATOCRIT: 41.7 % (ref 36.0–46.0)
Hemoglobin: 13.7 g/dL (ref 12.0–15.0)
MCHC: 32.8 g/dL (ref 30.0–36.0)
MCV: 86.7 fl (ref 78.0–100.0)
PLATELETS: 294 10*3/uL (ref 150.0–400.0)
RBC: 4.81 Mil/uL (ref 3.87–5.11)
RDW: 14 % (ref 11.5–15.5)
WBC: 8.3 10*3/uL (ref 4.0–10.5)

## 2018-03-03 LAB — HEMOGLOBIN A1C: Hgb A1c MFr Bld: 5.4 % (ref 4.6–6.5)

## 2018-03-03 NOTE — Addendum Note (Signed)
Addended by: Steve Rattler A on: 03/03/2018 09:55 AM   Modules accepted: Orders

## 2018-03-03 NOTE — Addendum Note (Signed)
Addended by: Pearline Cables on: 03/03/2018 09:33 AM   Modules accepted: Orders

## 2018-03-03 NOTE — Patient Instructions (Addendum)
It was nice to see you today, I will be in touch with your labs ASAP We will set you up for a mammogram, and ultrasound of the left breast as needed.  I did notice a small bump above the left nipple, I tend to think this is benign but we will get further imaging to make sure  I will also fill out your form once we get your labs in  Keep up the good work with exercise  Health Maintenance, Female Adopting a healthy lifestyle and getting preventive care can go a long way to promote health and wellness. Talk with your health care provider about what schedule of regular examinations is right for you. This is a good chance for you to check in with your provider about disease prevention and staying healthy. In between checkups, there are plenty of things you can do on your own. Experts have done a lot of research about which lifestyle changes and preventive measures are most likely to keep you healthy. Ask your health care provider for more information. Weight and diet Eat a healthy diet  Be sure to include plenty of vegetables, fruits, low-fat dairy products, and lean protein.  Do not eat a lot of foods high in solid fats, added sugars, or salt.  Get regular exercise. This is one of the most important things you can do for your health. ? Most adults should exercise for at least 150 minutes each week. The exercise should increase your heart rate and make you sweat (moderate-intensity exercise). ? Most adults should also do strengthening exercises at least twice a week. This is in addition to the moderate-intensity exercise. Maintain a healthy weight  Body mass index (BMI) is a measurement that can be used to identify possible weight problems. It estimates body fat based on height and weight. Your health care provider can help determine your BMI and help you achieve or maintain a healthy weight.  For females 55 years of age and older: ? A BMI below 18.5 is considered underweight. ? A BMI of 18.5 to  24.9 is normal. ? A BMI of 25 to 29.9 is considered overweight. ? A BMI of 30 and above is considered obese. Watch levels of cholesterol and blood lipids  You should start having your blood tested for lipids and cholesterol at 36 years of age, then have this test every 5 years.  You may need to have your cholesterol levels checked more often if: ? Your lipid or cholesterol levels are high. ? You are older than 36 years of age. ? You are at high risk for heart disease. Cancer screening Lung Cancer  Lung cancer screening is recommended for adults 69-71 years old who are at high risk for lung cancer because of a history of smoking.  A yearly low-dose CT scan of the lungs is recommended for people who: ? Currently smoke. ? Have quit within the past 15 years. ? Have at least a 30-pack-year history of smoking. A pack year is smoking an average of one pack of cigarettes a day for 1 year.  Yearly screening should continue until it has been 15 years since you quit.  Yearly screening should stop if you develop a health problem that would prevent you from having lung cancer treatment. Breast Cancer  Practice breast self-awareness. This means understanding how your breasts normally appear and feel.  It also means doing regular breast self-exams. Let your health care provider know about any changes, no matter how small.  If you are in your 20s or 30s, you should have a clinical breast exam (CBE) by a health care provider every 1-3 years as part of a regular health exam.  If you are 73 or older, have a CBE every year. Also consider having a breast X-ray (mammogram) every year.  If you have a family history of breast cancer, talk to your health care provider about genetic screening.  If you are at high risk for breast cancer, talk to your health care provider about having an MRI and a mammogram every year.  Breast cancer gene (BRCA) assessment is recommended for women who have family members  with BRCA-related cancers. BRCA-related cancers include: ? Breast. ? Ovarian. ? Tubal. ? Peritoneal cancers.  Results of the assessment will determine the need for genetic counseling and BRCA1 and BRCA2 testing. Cervical Cancer Your health care provider may recommend that you be screened regularly for cancer of the pelvic organs (ovaries, uterus, and vagina). This screening involves a pelvic examination, including checking for microscopic changes to the surface of your cervix (Pap test). You may be encouraged to have this screening done every 3 years, beginning at age 55.  For women ages 89-65, health care providers may recommend pelvic exams and Pap testing every 3 years, or they may recommend the Pap and pelvic exam, combined with testing for human papilloma virus (HPV), every 5 years. Some types of HPV increase your risk of cervical cancer. Testing for HPV may also be done on women of any age with unclear Pap test results.  Other health care providers may not recommend any screening for nonpregnant women who are considered low risk for pelvic cancer and who do not have symptoms. Ask your health care provider if a screening pelvic exam is right for you.  If you have had past treatment for cervical cancer or a condition that could lead to cancer, you need Pap tests and screening for cancer for at least 20 years after your treatment. If Pap tests have been discontinued, your risk factors (such as having a new sexual partner) need to be reassessed to determine if screening should resume. Some women have medical problems that increase the chance of getting cervical cancer. In these cases, your health care provider may recommend more frequent screening and Pap tests. Colorectal Cancer  This type of cancer can be detected and often prevented.  Routine colorectal cancer screening usually begins at 36 years of age and continues through 36 years of age.  Your health care provider may recommend  screening at an earlier age if you have risk factors for colon cancer.  Your health care provider may also recommend using home test kits to check for hidden blood in the stool.  A small camera at the end of a tube can be used to examine your colon directly (sigmoidoscopy or colonoscopy). This is done to check for the earliest forms of colorectal cancer.  Routine screening usually begins at age 27.  Direct examination of the colon should be repeated every 5-10 years through 36 years of age. However, you may need to be screened more often if early forms of precancerous polyps or small growths are found. Skin Cancer  Check your skin from head to toe regularly.  Tell your health care provider about any new moles or changes in moles, especially if there is a change in a mole's shape or color.  Also tell your health care provider if you have a mole that is larger than the  size of a pencil eraser.  Always use sunscreen. Apply sunscreen liberally and repeatedly throughout the day.  Protect yourself by wearing long sleeves, pants, a wide-brimmed hat, and sunglasses whenever you are outside. Heart disease, diabetes, and high blood pressure  High blood pressure causes heart disease and increases the risk of stroke. High blood pressure is more likely to develop in: ? People who have blood pressure in the high end of the normal range (130-139/85-89 mm Hg). ? People who are overweight or obese. ? People who are African American.  If you are 57-43 years of age, have your blood pressure checked every 3-5 years. If you are 50 years of age or older, have your blood pressure checked every year. You should have your blood pressure measured twice-once when you are at a hospital or clinic, and once when you are not at a hospital or clinic. Record the average of the two measurements. To check your blood pressure when you are not at a hospital or clinic, you can use: ? An automated blood pressure machine at a  pharmacy. ? A home blood pressure monitor.  If you are between 66 years and 36 years old, ask your health care provider if you should take aspirin to prevent strokes.  Have regular diabetes screenings. This involves taking a blood sample to check your fasting blood sugar level. ? If you are at a normal weight and have a low risk for diabetes, have this test once every three years after 35 years of age. ? If you are overweight and have a high risk for diabetes, consider being tested at a younger age or more often. Preventing infection Hepatitis B  If you have a higher risk for hepatitis B, you should be screened for this virus. You are considered at high risk for hepatitis B if: ? You were born in a country where hepatitis B is common. Ask your health care provider which countries are considered high risk. ? Your parents were born in a high-risk country, and you have not been immunized against hepatitis B (hepatitis B vaccine). ? You have HIV or AIDS. ? You use needles to inject street drugs. ? You live with someone who has hepatitis B. ? You have had sex with someone who has hepatitis B. ? You get hemodialysis treatment. ? You take certain medicines for conditions, including cancer, organ transplantation, and autoimmune conditions. Hepatitis C  Blood testing is recommended for: ? Everyone born from [birth year] through 1963. ? Anyone with known risk factors for hepatitis C. Sexually transmitted infections (STIs)  You should be screened for sexually transmitted infections (STIs) including gonorrhea and chlamydia if: ? You are sexually active and are younger than 36 years of age. ? You are older than 36 years of age and your health care provider tells you that you are at risk for this type of infection. ? Your sexual activity has changed since you were last screened and you are at an increased risk for chlamydia or gonorrhea. Ask your health care provider if you are at risk.  If you do not have  HIV, but are at risk, it may be recommended that you take a prescription medicine daily to prevent HIV infection. This is called pre-exposure prophylaxis (PrEP). You are considered at risk if: ? You are sexually active and do not regularly use condoms or know the HIV status of your partner(s). ? You take drugs by injection. ? You are sexually active with a partner who has HIV.  Talk with your health care provider about whether you are at high risk of being infected with HIV. If you choose to begin PrEP, you should first be tested for HIV. You should then be tested every 3 months for as long as you are taking PrEP. Pregnancy  If you are premenopausal and you may become pregnant, ask your health care provider about preconception counseling.  If you may become pregnant, take 400 to 800 micrograms (mcg) of folic acid every day.  If you want to prevent pregnancy, talk to your health care provider about birth control (contraception). Osteoporosis and menopause  Osteoporosis is a disease in which the bones lose minerals and strength with aging. This can result in serious bone fractures. Your risk for osteoporosis can be identified using a bone density scan.  If you are 100 years of age or older, or if you are at risk for osteoporosis and fractures, ask your health care provider if you should be screened.  Ask your health care provider whether you should take a calcium or vitamin D supplement to lower your risk for osteoporosis.  Menopause may have certain physical symptoms and risks.  Hormone replacement therapy may reduce some of these symptoms and risks. Talk to your health care provider about whether hormone replacement therapy is right for you. Follow these instructions at home:  Schedule regular health, dental, and eye exams.  Stay current with your immunizations.  Do not use any tobacco products including cigarettes, chewing tobacco, or electronic cigarettes.  If you are pregnant, do not  drink alcohol.  If you are breastfeeding, limit how much and how often you drink alcohol.  Limit alcohol intake to no more than 1 drink per day for nonpregnant women. One drink equals 12 ounces of beer, 5 ounces of wine, or 1 ounces of hard liquor.  Do not use street drugs.  Do not share needles.  Ask your health care provider for help if you need support or information about quitting drugs.  Tell your health care provider if you often feel depressed.  Tell your health care provider if you have ever been abused or do not feel safe at home. This information is not intended to replace advice given to you by your health care provider. Make sure you discuss any questions you have with your health care provider. Document Released: 07/24/2010 Document Revised: 06/16/2015 Document Reviewed: 10/12/2014 Elsevier Interactive Patient Education  2019 [employer].

## 2018-03-05 LAB — CYTOLOGY - PAP
Diagnosis: UNDETERMINED — AB
HPV: NOT DETECTED

## 2018-03-07 ENCOUNTER — Ambulatory Visit
Admission: RE | Admit: 2018-03-07 | Discharge: 2018-03-07 | Disposition: A | Discharge status: Home / Self Care | Payer: BLUE CROSS/BLUE SHIELD | Source: Ambulatory Visit | Attending: Family Medicine | Admitting: Family Medicine

## 2018-03-07 DIAGNOSIS — N6320 Unspecified lump in the left breast, unspecified quadrant: Secondary | ICD-10-CM

## 2018-03-09 ENCOUNTER — Encounter: Payer: Self-pay | Admitting: Family Medicine

## 2018-06-11 ENCOUNTER — Encounter: Payer: Self-pay | Admitting: Family Medicine

## 2018-06-12 NOTE — Telephone Encounter (Signed)
Should I schedule her a virtual visit or do you need to look in the ear?

## 2018-10-19 ENCOUNTER — Encounter: Payer: Self-pay | Admitting: Family Medicine

## 2018-11-17 ENCOUNTER — Encounter: Payer: Self-pay | Admitting: Family Medicine

## 2018-11-19 ENCOUNTER — Other Ambulatory Visit: Payer: Self-pay

## 2018-11-19 DIAGNOSIS — Z20822 Contact with and (suspected) exposure to covid-19: Secondary | ICD-10-CM

## 2018-11-20 LAB — NOVEL CORONAVIRUS, NAA: SARS-CoV-2, NAA: NOT DETECTED

## 2019-01-02 ENCOUNTER — Other Ambulatory Visit: Payer: Self-pay

## 2019-01-02 DIAGNOSIS — Z20822 Contact with and (suspected) exposure to covid-19: Secondary | ICD-10-CM

## 2019-01-03 LAB — NOVEL CORONAVIRUS, NAA: SARS-CoV-2, NAA: NOT DETECTED

## 2019-01-14 ENCOUNTER — Ambulatory Visit: Payer: BC Managed Care – PPO | Attending: Internal Medicine

## 2019-01-14 DIAGNOSIS — Z20822 Contact with and (suspected) exposure to covid-19: Secondary | ICD-10-CM

## 2019-01-15 LAB — NOVEL CORONAVIRUS, NAA: SARS-CoV-2, NAA: NOT DETECTED

## 2019-01-17 ENCOUNTER — Encounter: Payer: Self-pay | Admitting: Family Medicine

## 2019-02-15 ENCOUNTER — Encounter: Payer: Self-pay | Admitting: Family Medicine

## 2019-02-16 MED ORDER — BLISOVI 24 FE 1-20 MG-MCG(24) PO TABS: ORAL_TABLET | ORAL | 4 refills | Status: DC

## 2019-03-05 NOTE — Progress Notes (Deleted)
[facility] [address], [address] [place], [place] [address] [phone]  Date:  03/09/2019   Name:  Tara Lam   DOB:  [birth year]   MRN:  [id number]  PCP:  Pearline Cables, MD    Chief Complaint: No chief complaint on file.   History of Present Illness:  Tara Lam is a 39 y.o. very pleasant female patient who presents with the following:  Generally healthy young woman here today for a physical exam Last seen by myself about 1 year ago for a physical At that time she was planning to [facility], and will be gaining 2 stepchildren.  She also has 2 children of her own who are 9 and 46 years old She works for [employer] Patient at [facility] Last year her Pap showed ASCUS with negative HPV-we will repeat Pap today Most recent blood work 1 year ago Do HIV screening Flu shot up-to-date Patient Active Problem List   Diagnosis Date Noted  . 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 Small stature 02/02/2016    Past Medical History:  Diagnosis Date  . Chicken pox     Past Surgical History:  Procedure Laterality Date  . CESAREAN SECTION  04/03/2010  . TONSILLECTOMY    . WISDOM TOOTH EXTRACTION      Social History   Tobacco Use  . Smoking status: Never Smoker  . Smokeless tobacco: Never Used  Substance Use Topics  . Alcohol use: Yes    Comment: social drinker  . Drug use: No    Family History  Problem Relation Age of Onset  . Cancer Maternal Grandmother   . Diabetes Maternal Grandmother   . Bladder Cancer Maternal Grandfather   . Breast cancer Paternal Grandmother   . Cancer Paternal Grandfather     No Known Allergies  Medication list has been reviewed and updated.  Current Outpatient Medications on File Prior to Visit  Medication Sig Dispense Refill  . Norethindrone Acetate-Ethinyl Estrad-FE (BLISOVI 24 FE) 1-20 MG-MCG(24) tablet TAKE 1 TABLET EVERY DAY *SKIP PLACEBO 3 Package 4    No current facility-administered medications on file prior to visit.    Review of Systems:  As per HPI- otherwise negative.   Physical Examination: There were no vitals filed for this visit. There were no vitals filed for this visit. There is no height or weight on file to calculate BMI. Ideal Body Weight:    GEN: no acute distress. HEENT: Atraumatic, Normocephalic.  Ears and Nose: No external deformity. CV: RRR, No M/G/R. No JVD. No thrill. No extra heart sounds. PULM: CTA B, no wheezes, crackles, rhonchi. No retractions. No resp. distress. No accessory muscle use. ABD: S, NT, ND, +BS. No rebound. No HSM. EXTR: No c/c/e PSYCH: Normally interactive. Conversant.    Assessment and Plan: ***  Signed Abbe Amsterdam, MD

## 2019-03-05 NOTE — Patient Instructions (Signed)
It was great to see you again today, I will be in touch with your results as soon as possible   Health Maintenance, Female Adopting a healthy lifestyle and getting preventive care are important in promoting health and wellness. Ask your health care provider about:  The right schedule for you to have regular tests and exams.  Things you can do on your own to prevent diseases and keep yourself healthy. What should I know about diet, weight, and exercise? Eat a healthy diet   Eat a diet that includes plenty of vegetables, fruits, low-fat dairy products, and lean protein.  Do not eat a lot of foods that are high in solid fats, added sugars, or sodium. Maintain a healthy weight Body mass index (BMI) is used to identify weight problems. It estimates body fat based on height and weight. Your health care provider can help determine your BMI and help you achieve or maintain a healthy weight. Get regular exercise Get regular exercise. This is one of the most important things you can do for your health. Most adults should:  Exercise for at least 150 minutes each week. The exercise should increase your heart rate and make you sweat (moderate-intensity exercise).  Do strengthening exercises at least twice a week. This is in addition to the moderate-intensity exercise.  Spend less time sitting. Even light physical activity can be beneficial. Watch cholesterol and blood lipids Have your blood tested for lipids and cholesterol at 37 years of age, then have this test every 5 years. Have your cholesterol levels checked more often if:  Your lipid or cholesterol levels are high.  You are older than 37 years of age.  You are at high risk for heart disease. What should I know about cancer screening? Depending on your health history and family history, you may need to have cancer screening at various ages. This may include screening for:  Breast cancer.  Cervical cancer.  Colorectal  cancer.  Skin cancer.  Lung cancer. What should I know about heart disease, diabetes, and high blood pressure? Blood pressure and heart disease  High blood pressure causes heart disease and increases the risk of stroke. This is more likely to develop in people who have high blood pressure readings, are of African descent, or are overweight.  Have your blood pressure checked: ? Every 3-5 years if you are 12-48 years of age. ? Every year if you are 19 years old or older. Diabetes Have regular diabetes screenings. This checks your fasting blood sugar level. Have the screening done:  Once every three years after age 47 if you are at a normal weight and have a low risk for diabetes.  More often and at a younger age if you are overweight or have a high risk for diabetes. What should I know about preventing infection? Hepatitis B If you have a higher risk for hepatitis B, you should be screened for this virus. Talk with your health care provider to find out if you are at risk for hepatitis B infection. Hepatitis C Testing is recommended for:  Everyone born from [birth year] through 1963.  Anyone with known risk factors for hepatitis C. Sexually transmitted infections (STIs)  Get screened for STIs, including gonorrhea and chlamydia, if: ? You are sexually active and are younger than 37 years of age. ? You are older than 37 years of age and your health care provider tells you that you are at risk for this type of infection. ? Your sexual activity has  changed since you were last screened, and you are at increased risk for chlamydia or gonorrhea. Ask your health care provider if you are at risk.  Ask your health care provider about whether you are at high risk for HIV. Your health care provider may recommend a prescription medicine to help prevent HIV infection. If you choose to take medicine to prevent HIV, you should first get tested for HIV. You should then be tested every 3 months for as long as  you are taking the medicine. Pregnancy  If you are about to stop having your period (premenopausal) and you may become pregnant, seek counseling before you get pregnant.  Take 400 to 800 micrograms (mcg) of folic acid every day if you become pregnant.  Ask for birth control (contraception) if you want to prevent pregnancy. Osteoporosis and menopause Osteoporosis is a disease in which the bones lose minerals and strength with aging. This can result in bone fractures. If you are 4 years old or older, or if you are at risk for osteoporosis and fractures, ask your health care provider if you should:  Be screened for bone loss.  Take a calcium or vitamin D supplement to lower your risk of fractures.  Be given hormone replacement therapy (HRT) to treat symptoms of menopause. Follow these instructions at home: Lifestyle  Do not use any products that contain nicotine or tobacco, such as cigarettes, e-cigarettes, and chewing tobacco. If you need help quitting, ask your health care provider.  Do not use street drugs.  Do not share needles.  Ask your health care provider for help if you need support or information about quitting drugs. Alcohol use  Do not drink alcohol if: ? Your health care provider tells you not to drink. ? You are pregnant, may be pregnant, or are planning to become pregnant.  If you drink alcohol: ? Limit how much you use to 0-1 drink a day. ? Limit intake if you are breastfeeding.  Be aware of how much alcohol is in your drink. In the [place] one drink equals one 12 oz bottle of beer (355 mL), one 5 oz glass of wine (148 mL), or one 1 oz glass of hard liquor (44 mL). General instructions  Schedule regular health, dental, and eye exams.  Stay current with your vaccines.  Tell your health care provider if: ? You often feel depressed. ? You have ever been abused or do not feel safe at home. Summary  Adopting a healthy lifestyle and getting preventive care are  important in promoting health and wellness.  Follow your health care provider's instructions about healthy diet, exercising, and getting tested or screened for diseases.  Follow your health care provider's instructions on monitoring your cholesterol and blood pressure. This information is not intended to replace advice given to you by your health care provider. Make sure you discuss any questions you have with your health care provider. Document Revised: 01/01/2018 Document Reviewed: 01/01/2018 Elsevier Patient Education  2020 [employer].

## 2019-03-08 NOTE — Patient Instructions (Addendum)
It was nice to see you again today, I will be in touch with your lab results as soon as possible We will set you up for a thyroid ultrasound asap   Health Maintenance, Female Adopting a healthy lifestyle and getting preventive care are important in promoting health and wellness. Ask your health care provider about:  The right schedule for you to have regular tests and exams.  Things you can do on your own to prevent diseases and keep yourself healthy. What should I know about diet, weight, and exercise? Eat a healthy diet   Eat a diet that includes plenty of vegetables, fruits, low-fat dairy products, and lean protein.  Do not eat a lot of foods that are high in solid fats, added sugars, or sodium. Maintain a healthy weight Body mass index (BMI) is used to identify weight problems. It estimates body fat based on height and weight. Your health care provider can help determine your BMI and help you achieve or maintain a healthy weight. Get regular exercise Get regular exercise. This is one of the most important things you can do for your health. Most adults should:  Exercise for at least 150 minutes each week. The exercise should increase your heart rate and make you sweat (moderate-intensity exercise).  Do strengthening exercises at least twice a week. This is in addition to the moderate-intensity exercise.  Spend less time sitting. Even light physical activity can be beneficial. Watch cholesterol and blood lipids Have your blood tested for lipids and cholesterol at 37 years of age, then have this test every 5 years. Have your cholesterol levels checked more often if:  Your lipid or cholesterol levels are high.  You are older than 37 years of age.  You are at high risk for heart disease. What should I know about cancer screening? Depending on your health history and family history, you may need to have cancer screening at various ages. This may include screening for:  Breast  cancer.  Cervical cancer.  Colorectal cancer.  Skin cancer.  Lung cancer. What should I know about heart disease, diabetes, and high blood pressure? Blood pressure and heart disease  High blood pressure causes heart disease and increases the risk of stroke. This is more likely to develop in people who have high blood pressure readings, are of African descent, or are overweight.  Have your blood pressure checked: ? Every 3-5 years if you are 16-85 years of age. ? Every year if you are 61 years old or older. Diabetes Have regular diabetes screenings. This checks your fasting blood sugar level. Have the screening done:  Once every three years after age 31 if you are at a normal weight and have a low risk for diabetes.  More often and at a younger age if you are overweight or have a high risk for diabetes. What should I know about preventing infection? Hepatitis B If you have a higher risk for hepatitis B, you should be screened for this virus. Talk with your health care provider to find out if you are at risk for hepatitis B infection. Hepatitis C Testing is recommended for:  Everyone born from [birth year] through 1965.  Anyone with known risk factors for hepatitis C. Sexually transmitted infections (STIs)  Get screened for STIs, including gonorrhea and chlamydia, if: ? You are sexually active and are younger than 37 years of age. ? You are older than 37 years of age and your health care provider tells you that you are at  risk for this type of infection. ? Your sexual activity has changed since you were last screened, and you are at increased risk for chlamydia or gonorrhea. Ask your health care provider if you are at risk.  Ask your health care provider about whether you are at high risk for HIV. Your health care provider may recommend a prescription medicine to help prevent HIV infection. If you choose to take medicine to prevent HIV, you should first get tested for HIV. You should  then be tested every 3 months for as long as you are taking the medicine. Pregnancy  If you are about to stop having your period (premenopausal) and you may become pregnant, seek counseling before you get pregnant.  Take 400 to 800 micrograms (mcg) of folic acid every day if you become pregnant.  Ask for birth control (contraception) if you want to prevent pregnancy. Osteoporosis and menopause Osteoporosis is a disease in which the bones lose minerals and strength with aging. This can result in bone fractures. If you are 61 years old or older, or if you are at risk for osteoporosis and fractures, ask your health care provider if you should:  Be screened for bone loss.  Take a calcium or vitamin D supplement to lower your risk of fractures.  Be given hormone replacement therapy (HRT) to treat symptoms of menopause. Follow these instructions at home: Lifestyle  Do not use any products that contain nicotine or tobacco, such as cigarettes, e-cigarettes, and chewing tobacco. If you need help quitting, ask your health care provider.  Do not use street drugs.  Do not share needles.  Ask your health care provider for help if you need support or information about quitting drugs. Alcohol use  Do not drink alcohol if: ? Your health care provider tells you not to drink. ? You are pregnant, may be pregnant, or are planning to become pregnant.  If you drink alcohol: ? Limit how much you use to 0-1 drink a day. ? Limit intake if you are breastfeeding.  Be aware of how much alcohol is in your drink. In the [place] one drink equals one 12 oz bottle of beer (355 mL), one 5 oz glass of wine (148 mL), or one 1 oz glass of hard liquor (44 mL). General instructions  Schedule regular health, dental, and eye exams.  Stay current with your vaccines.  Tell your health care provider if: ? You often feel depressed. ? You have ever been abused or do not feel safe at home. Summary  Adopting a  healthy lifestyle and getting preventive care are important in promoting health and wellness.  Follow your health care provider's instructions about healthy diet, exercising, and getting tested or screened for diseases.  Follow your health care provider's instructions on monitoring your cholesterol and blood pressure. This information is not intended to replace advice given to you by your health care provider. Make sure you discuss any questions you have with your health care provider. Document Revised: 01/01/2018 Document Reviewed: 01/01/2018 Elsevier Patient Education  2020 [employer].

## 2019-03-08 NOTE — Progress Notes (Addendum)
[facility] [place], [place], [place] [address] [phone]  Date:  03/11/2019   Name:  Tara Lam   DOB:  12-16-1980   MRN:  [id number]  PCP:  Darreld Mclean, MD    Chief Complaint: Annual Exam   History of Present Illness:  Tara Lam is a 39 y.o. very pleasant female patient who presents with the following:  Here today for complete physical Last seen by myself for physical about 1 year ago That time she was planning to remarry a gentleman with 2 children, about the ages of her own 2 children They got married in September- a little late due to pandemic Children are 62, 60, 22 and 16 yo They are going to [facility] in May of this year   She is a patient at [facility] Status post BTL  Pap is up-to-date- done last year  Flu shot done already this year Routine labs 1 year ago, update today She had a palpable mass in her left breast last year, this turned out to be a cyst.  She was advised to have a screening mammogram starting at age 85  She is exercising a bit less due to the pandemic and gained a few lbs; she is a bit frustrated by this, hopes to get back to her baseline Her mood is overall good Her work is stressful right now   She does have a family history of hyperlipidemia -we discussed cholesterol medication in the past but so far have not started any prescription  Wt Readings from Last 3 Encounters:  03/11/19 128 lb (58.1 kg)  03/03/18 118 lb (53.5 kg)  01/28/17 117 lb 6.4 oz (53.3 kg)     Patient Active Problem List   Diagnosis Date Noted  . 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 Small stature 02/02/2016    Past Medical History:  Diagnosis Date  . Chicken pox     Past Surgical History:  Procedure Laterality Date  . CESAREAN SECTION  04/03/2010  . TONSILLECTOMY    . WISDOM TOOTH EXTRACTION      Social History   Tobacco Use  . Smoking status: Never Smoker  .  Smokeless tobacco: Never Used  Substance Use Topics  . Alcohol use: Yes    Comment: social drinker  . Drug use: No    Family History  Problem Relation Age of Onset  . Cancer Maternal Grandmother   . Diabetes Maternal Grandmother   . Bladder Cancer Maternal Grandfather   . Breast cancer Paternal Grandmother   . Cancer Paternal Grandfather     No Known Allergies  Medication list has been reviewed and updated.  Current Outpatient Medications on File Prior to Visit  Medication Sig Dispense Refill  . Norethindrone Acetate-Ethinyl Estrad-FE (BLISOVI 24 FE) 1-20 MG-MCG(24) tablet TAKE 1 TABLET EVERY DAY *SKIP PLACEBO 3 Package 4   No current facility-administered medications on file prior to visit.    Review of Systems:  As per HPI- otherwise negative. No chest pain or shortness of breath  Physical Examination: Vitals:   03/11/19 0852  BP: 120/78  Pulse: 71  Temp: (!) 97 F (36.1 C)  SpO2: 98%   Vitals:   03/11/19 0852  Weight: 128 lb (58.1 kg)  Height: 4\' 7"  (1.397 m)   Body mass index is 29.75 kg/m. Ideal Body Weight: Weight in (lb) to have BMI = 25: 107.3  GEN: no  acute distress.  Very petite build, overweight.  Otherwise looks well HEENT: Atraumatic, Normocephalic.  PERRL, TM within normal limits bilaterally.  Her thyroid is enlarged on exam today, nontender Ears and Nose: No external deformity. CV: RRR, No M/G/R. No JVD. No thrill. No extra heart sounds. PULM: CTA B, no wheezes, crackles, rhonchi. No retractions. No resp. distress. No accessory muscle use. ABD: S, NT, ND, +BS. No rebound. No HSM. EXTR: No c/c/e PSYCH: Normally interactive. Conversant.    Assessment and Plan: Physical exam  Screening for deficiency anemia - Plan: CBC  Screening for diabetes mellitus - Plan: Comprehensive metabolic panel, Hemoglobin A1c  Screening for hyperlipidemia - Plan: Lipid panel  Screening for thyroid disorder - Plan: TSH  Screening for HIV (human  immunodeficiency virus) - Plan: HIV Antibody (routine testing w rflx)  Thyroid enlargement - Plan: T3, free, US THYROID  Overweight  Here today for routine physical Thyroid enlargement noted, check TSH and T3, thyroid ultrasound Otherwise routine labs pending as above Ariyan plans to work on getting more exercise and will continue to watch her diet. Will plan further follow- up pending labs.  This visit occurred during the SARS-CoV-2 public health emergency.  Safety protocols were in place, including screening questions prior to the visit, additional usage of staff PPE, and extensive cleaning of exam room while observing appropriate contact time as indicated for disinfecting solutions.    Signed Abbe Amsterdam, MD   Received her labs, message to patient  Results for orders placed or performed in visit on 03/11/19  CBC  Result Value Ref Range   WBC 8.3 4.0 - 10.5 K/uL   RBC 4.52 3.87 - 5.11 Mil/uL   Platelets 271.0 150.0 - 400.0 K/uL   Hemoglobin 13.1 12.0 - 15.0 g/dL   HCT 42.3 53.6 - 14.4 %   MCV 86.8 78.0 - 100.0 fl   MCHC 33.3 30.0 - 36.0 g/dL   RDW 31.5 40.0 - 86.7 %  Comprehensive metabolic panel  Result Value Ref Range   Sodium 139 135 - 145 mEq/L   Potassium 4.2 3.5 - 5.1 mEq/L   Chloride 109 96 - 112 mEq/L   CO2 24 19 - 32 mEq/L   Glucose, Bld 92 70 - 99 mg/dL   BUN 12 6 - 23 mg/dL   Creatinine, Ser 6.19 0.40 - 1.20 mg/dL   Total Bilirubin 0.4 0.2 - 1.2 mg/dL   Alkaline Phosphatase 57 39 - 117 U/L   AST 10 0 - 37 U/L   ALT 11 0 - 35 U/L   Total Protein 6.6 6.0 - 8.3 g/dL   Albumin 4.1 3.5 - 5.2 g/dL   GFR 50.93 >26.71 mL/min   Calcium 8.9 8.4 - 10.5 mg/dL  Hemoglobin I4P  Result Value Ref Range   Hgb A1c MFr Bld 6.1 4.6 - 6.5 %  Lipid panel  Result Value Ref Range   Cholesterol 202 (H) 0 - 200 mg/dL   Triglycerides 80.9 0.0 - 149.0 mg/dL   HDL 98.33 (L) >82.50 mg/dL   VLDL 53.9 0.0 - 76.7 mg/dL   LDL Cholesterol 341 (H) 0 - 99 mg/dL   Total CHOL/HDL  Ratio 6    NonHDL 166.67   HIV Antibody (routine testing w rflx)  Result Value Ref Range   HIV 1&2 Ab, 4th Generation NON-REACTIVE NON-REACTI  TSH  Result Value Ref Range   TSH 1.33 0.35 - 4.50 uIU/mL  T3, free  Result Value Ref Range   T3, Free 3.1 2.3 -  4.2 pg/mL

## 2019-03-09 ENCOUNTER — Ambulatory Visit: Payer: BC Managed Care – PPO | Admitting: Family Medicine

## 2019-03-10 ENCOUNTER — Other Ambulatory Visit: Payer: Self-pay

## 2019-03-11 ENCOUNTER — Encounter: Payer: Self-pay | Admitting: Family Medicine

## 2019-03-11 ENCOUNTER — Ambulatory Visit (INDEPENDENT_AMBULATORY_CARE_PROVIDER_SITE_OTHER): Payer: BC Managed Care – PPO | Admitting: Family Medicine

## 2019-03-11 VITALS — BP 120/78 | HR 71 | Temp 97.0°F | Ht <= 58 in | Wt 128.0 lb

## 2019-03-11 DIAGNOSIS — E785 Hyperlipidemia, unspecified: Secondary | ICD-10-CM

## 2019-03-11 DIAGNOSIS — Z131 Encounter for screening for diabetes mellitus: Secondary | ICD-10-CM | POA: Diagnosis not present

## 2019-03-11 DIAGNOSIS — Z130 Encounter for screening for diseases of the blood and blood-forming organs and certain disorders involving the immune mechanism: Secondary | ICD-10-CM

## 2019-03-11 DIAGNOSIS — E663 Overweight: Secondary | ICD-10-CM

## 2019-03-11 DIAGNOSIS — Z13220 Encounter for screening for lipoid disorders: Secondary | ICD-10-CM

## 2019-03-11 DIAGNOSIS — Z0000 Encounter for general adult medical examination without abnormal findings: Secondary | ICD-10-CM | POA: Diagnosis not present

## 2019-03-11 DIAGNOSIS — Z1329 Encounter for screening for other suspected endocrine disorder: Secondary | ICD-10-CM

## 2019-03-11 DIAGNOSIS — R7303 Prediabetes: Secondary | ICD-10-CM

## 2019-03-11 DIAGNOSIS — Z114 Encounter for screening for human immunodeficiency virus [HIV]: Secondary | ICD-10-CM

## 2019-03-11 DIAGNOSIS — E049 Nontoxic goiter, unspecified: Secondary | ICD-10-CM | POA: Diagnosis not present

## 2019-03-11 LAB — COMPREHENSIVE METABOLIC PANEL
ALT: 11 U/L (ref 0–35)
AST: 10 U/L (ref 0–37)
Albumin: 4.1 g/dL (ref 3.5–5.2)
Alkaline Phosphatase: 57 U/L (ref 39–117)
BUN: 12 mg/dL (ref 6–23)
CO2: 24 mEq/L (ref 19–32)
Calcium: 8.9 mg/dL (ref 8.4–10.5)
Chloride: 109 mEq/L (ref 96–112)
Creatinine, Ser: 0.72 mg/dL (ref 0.40–1.20)
GFR: 91.4 mL/min (ref >60.00)
Glucose, Bld: 92 mg/dL (ref 70–99)
Potassium: 4.2 mEq/L (ref 3.5–5.1)
Sodium: 139 mEq/L (ref 135–145)
Total Bilirubin: 0.4 mg/dL (ref 0.2–1.2)
Total Protein: 6.6 g/dL (ref 6.0–8.3)

## 2019-03-11 LAB — LIPID PANEL
Cholesterol: 202 mg/dL — ABNORMAL HIGH (ref 0–200)
HDL: 35 mg/dL — ABNORMAL LOW (ref >39.00)
LDL Cholesterol: 151 mg/dL — ABNORMAL HIGH (ref 0–99)
NonHDL: 166.67
Total CHOL/HDL Ratio: 6
Triglycerides: 79 mg/dL (ref 0.0–149.0)
VLDL: 15.8 mg/dL (ref 0.0–40.0)

## 2019-03-11 LAB — CBC
HCT: 39.2 % (ref 36.0–46.0)
Hemoglobin: 13.1 g/dL (ref 12.0–15.0)
MCHC: 33.3 g/dL (ref 30.0–36.0)
MCV: 86.8 fl (ref 78.0–100.0)
Platelets: 271 10*3/uL (ref 150.0–400.0)
RBC: 4.52 Mil/uL (ref 3.87–5.11)
RDW: 13.4 % (ref 11.5–15.5)
WBC: 8.3 10*3/uL (ref 4.0–10.5)

## 2019-03-11 LAB — HEMOGLOBIN A1C: Hgb A1c MFr Bld: 6.1 % (ref 4.6–6.5)

## 2019-03-11 LAB — TSH: TSH: 1.33 u[IU]/mL (ref 0.35–4.50)

## 2019-03-11 LAB — T3, FREE: T3, Free: 3.1 pg/mL (ref 2.3–4.2)

## 2019-03-11 LAB — HIV ANTIBODY (ROUTINE TESTING W REFLEX): HIV 1&2 Ab, 4th Generation: NONREACTIVE

## 2019-03-12 MED ORDER — SIMVASTATIN 10 MG PO TABS: 10.0000 mg | ORAL_TABLET | Freq: Every day | ORAL | 3 refills | Status: DC

## 2019-03-16 ENCOUNTER — Ambulatory Visit (HOSPITAL_BASED_OUTPATIENT_CLINIC_OR_DEPARTMENT_OTHER)
Admission: RE | Admit: 2019-03-16 | Discharge: 2019-03-16 | Disposition: A | Discharge status: Home / Self Care | Payer: BC Managed Care – PPO | Source: Ambulatory Visit | Attending: Family Medicine | Admitting: Family Medicine

## 2019-03-16 ENCOUNTER — Encounter: Payer: Self-pay | Admitting: Family Medicine

## 2019-03-16 ENCOUNTER — Other Ambulatory Visit: Payer: Self-pay

## 2019-03-16 DIAGNOSIS — E049 Nontoxic goiter, unspecified: Secondary | ICD-10-CM | POA: Diagnosis present

## 2019-03-16 NOTE — Telephone Encounter (Signed)
Tara Bad do you know if they can just schedule an ultrasound?

## 2019-03-17 ENCOUNTER — Encounter: Payer: Self-pay | Admitting: Family Medicine

## 2019-03-17 ENCOUNTER — Telehealth: Payer: Self-pay | Admitting: Family Medicine

## 2019-03-17 DIAGNOSIS — E041 Nontoxic single thyroid nodule: Secondary | ICD-10-CM

## 2019-03-17 NOTE — Telephone Encounter (Signed)
Orders only

## 2019-03-23 ENCOUNTER — Telehealth: Payer: Self-pay | Admitting: Family Medicine

## 2019-03-23 NOTE — Telephone Encounter (Signed)
Patient states that she received a bill for a cancelled appointment.  Appointment Date : 03/09/2019.   Patient would like to know why she is receiving a bill when out office re-schedule that appointment to 03/11/2019.  Please advise.

## 2019-03-24 ENCOUNTER — Encounter: Payer: Self-pay | Admitting: Family Medicine

## 2019-03-26 NOTE — Telephone Encounter (Signed)
PT called again today. I let  Tara Lam know that Victorino Dike was working on it. And she should hear back soon

## 2019-03-27 NOTE — Telephone Encounter (Signed)
I called pt, but did not reach her to personally explain to her the situation with this billing issue.  I did leave a very detailed message on her voice mail explaining the situation and that the corrected information has been sent to our charge correction department so that the correct date of service (03/11/19) can be billed to [facility] for her CPE.  I apologized for any inconvenience and did let the patient know to please disregard any bills she may receive within the next 4-6 weeks and also let her know she will not be responsible for any no show fees should she get one from our office.  I left my name and phone number for her to contact me with any future issues she may have regarding this billing issue.

## 2019-04-03 ENCOUNTER — Encounter: Payer: Self-pay | Admitting: Family Medicine

## 2019-05-21 ENCOUNTER — Telehealth: Payer: Self-pay | Admitting: Family Medicine

## 2019-05-21 NOTE — Telephone Encounter (Signed)
Hello Tara Lam states she is still being doubled billed for  02/15 and 02/17th for her CPE. She stated you were suppose to do something to fix it but it's still not fixed .

## 2019-12-03 ENCOUNTER — Other Ambulatory Visit: Payer: Self-pay | Admitting: Family Medicine

## 2020-03-10 ENCOUNTER — Other Ambulatory Visit: Payer: Self-pay | Admitting: Family Medicine

## 2020-03-10 DIAGNOSIS — E785 Hyperlipidemia, unspecified: Secondary | ICD-10-CM

## 2020-03-11 NOTE — Patient Instructions (Addendum)
Good to see you again today- take care and I will be in touch with your labs Flu vaccine today Repeat pap next year We can also do your thyroid US next year  Take care!    Health Maintenance, Female Adopting a healthy lifestyle and getting preventive care are important in promoting health and wellness. Ask your health care provider about:  The right schedule for you to have regular tests and exams.  Things you can do on your own to prevent diseases and keep yourself healthy. What should I know about diet, weight, and exercise? Eat a healthy diet  Eat a diet that includes plenty of vegetables, fruits, low-fat dairy products, and lean protein.  Do not eat a lot of foods that are high in solid fats, added sugars, or sodium.   Maintain a healthy weight Body mass index (BMI) is used to identify weight problems. It estimates body fat based on height and weight. Your health care provider can help determine your BMI and help you achieve or maintain a healthy weight. Get regular exercise Get regular exercise. This is one of the most important things you can do for your health. Most adults should:  Exercise for at least 150 minutes each week. The exercise should increase your heart rate and make you sweat (moderate-intensity exercise).  Do strengthening exercises at least twice a week. This is in addition to the moderate-intensity exercise.  Spend less time sitting. Even light physical activity can be beneficial. Watch cholesterol and blood lipids Have your blood tested for lipids and cholesterol at 38 years of age, then have this test every 5 years. Have your cholesterol levels checked more often if:  Your lipid or cholesterol levels are high.  You are older than 38 years of age.  You are at high risk for heart disease. What should I know about cancer screening? Depending on your health history and family history, you may need to have cancer screening at various ages. This may include  screening for:  Breast cancer.  Cervical cancer.  Colorectal cancer.  Skin cancer.  Lung cancer. What should I know about heart disease, diabetes, and high blood pressure? Blood pressure and heart disease  High blood pressure causes heart disease and increases the risk of stroke. This is more likely to develop in people who have high blood pressure readings, are of African descent, or are overweight.  Have your blood pressure checked: ? Every 3-5 years if you are 95-4 years of age. ? Every year if you are 24 years old or older. Diabetes Have regular diabetes screenings. This checks your fasting blood sugar level. Have the screening done:  Once every three years after age 60 if you are at a normal weight and have a low risk for diabetes.  More often and at a younger age if you are overweight or have a high risk for diabetes. What should I know about preventing infection? Hepatitis B If you have a higher risk for hepatitis B, you should be screened for this virus. Talk with your health care provider to find out if you are at risk for hepatitis B infection. Hepatitis C Testing is recommended for:  Everyone born from [birth year] through 1965.  Anyone with known risk factors for hepatitis C. Sexually transmitted infections (STIs)  Get screened for STIs, including gonorrhea and chlamydia, if: ? You are sexually active and are younger than 38 years of age. ? You are older than 38 years of age and your health care  provider tells you that you are at risk for this type of infection. ? Your sexual activity has changed since you were last screened, and you are at increased risk for chlamydia or gonorrhea. Ask your health care provider if you are at risk.  Ask your health care provider about whether you are at high risk for HIV. Your health care provider may recommend a prescription medicine to help prevent HIV infection. If you choose to take medicine to prevent HIV, you should first get  tested for HIV. You should then be tested every 3 months for as long as you are taking the medicine. Pregnancy  If you are about to stop having your period (premenopausal) and you may become pregnant, seek counseling before you get pregnant.  Take 400 to 800 micrograms (mcg) of folic acid every day if you become pregnant.  Ask for birth control (contraception) if you want to prevent pregnancy. Osteoporosis and menopause Osteoporosis is a disease in which the bones lose minerals and strength with aging. This can result in bone fractures. If you are 58 years old or older, or if you are at risk for osteoporosis and fractures, ask your health care provider if you should:  Be screened for bone loss.  Take a calcium or vitamin D supplement to lower your risk of fractures.  Be given hormone replacement therapy (HRT) to treat symptoms of menopause. Follow these instructions at home: Lifestyle  Do not use any products that contain nicotine or tobacco, such as cigarettes, e-cigarettes, and chewing tobacco. If you need help quitting, ask your health care provider.  Do not use street drugs.  Do not share needles.  Ask your health care provider for help if you need support or information about quitting drugs. Alcohol use  Do not drink alcohol if: ? Your health care provider tells you not to drink. ? You are pregnant, may be pregnant, or are planning to become pregnant.  If you drink alcohol: ? Limit how much you use to 0-1 drink a day. ? Limit intake if you are breastfeeding.  Be aware of how much alcohol is in your drink. In the [place] one drink equals one 12 oz bottle of beer (355 mL), one 5 oz glass of wine (148 mL), or one 1 oz glass of hard liquor (44 mL). General instructions  Schedule regular health, dental, and eye exams.  Stay current with your vaccines.  Tell your health care provider if: ? You often feel depressed. ? You have ever been abused or do not feel safe at  home. Summary  Adopting a healthy lifestyle and getting preventive care are important in promoting health and wellness.  Follow your health care provider's instructions about healthy diet, exercising, and getting tested or screened for diseases.  Follow your health care provider's instructions on monitoring your cholesterol and blood pressure. This information is not intended to replace advice given to you by your health care provider. Make sure you discuss any questions you have with your health care provider. Document Revised: 01/01/2018 Document Reviewed: 01/01/2018 Elsevier Patient Education  2021 [employer].

## 2020-03-11 NOTE — Progress Notes (Addendum)
[facility] [address], [address] [place], [place] [address] [phone]  Date:  03/14/2020   Name:  Tara Lam   DOB:  May 29, 1980   MRN:  [id number]  PCP:  Pearline Cables, MD    Chief Complaint: Annual Exam   History of Present Illness:  Tara Lam is a 40 y.o. very pleasant female patient who presents with the following:  CPE today Last seen by myself about one year ago - at that time she had recently gotten married, they had 4 children between them  Things are continuing to go we with the family, the children are all getting along well. Her youngest is turning 63 and the oldest is 3.    Hep C screening-can update today covid series- done  Most recent labs one year ago - she is fasting this am  Flu vaccine- give today  Pap is UTD Mammogram 2020: IMPRESSION: 1. No mammographic or sonographic evidence of malignancy involving the LEFT breast. 2. No mammographic evidence of malignancy involving the RIGHT breast. 3. Benign simple cyst in the UPPER OUTER periareolar LEFT breast which accounts for the palpable concern. RECOMMENDATION: Screening mammogram at age 70 unless there are persistent or intervening clinical concerns. (Code:SM-B-40A)  She is able to do some walking for exercise, and does some strength exercises She works for [employer] in [place]  She has lost several pounds recently through diet and exercise changes.  Overall, she is feeling well and has no particular concerns Never been a smoker She is happy with current contraceptive method Patient Active Problem List   Diagnosis Date Noted  . Prediabetes 03/11/2019  . Dyslipidemia 03/11/2019  . 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 Small stature 02/02/2016    Past Medical History:  Diagnosis Date  . Chicken pox     Past Surgical History:  Procedure Laterality Date  . CESAREAN SECTION  04/03/2010  . TONSILLECTOMY    . WISDOM  TOOTH EXTRACTION      Social History   Tobacco Use  . Smoking status: Never Smoker  . Smokeless tobacco: Never Used  Substance Use Topics  . Alcohol use: Yes    Comment: social drinker  . Drug use: No    Family History  Problem Relation Age of Onset  . Cancer Maternal Grandmother   . Diabetes Maternal Grandmother   . Bladder Cancer Maternal Grandfather   . Breast cancer Paternal Grandmother   . Cancer Paternal Grandfather     No Known Allergies  Medication list has been reviewed and updated.  Current Outpatient Medications on File Prior to Visit  Medication Sig Dispense Refill  . BLISOVI 24 FE 1-20 MG-MCG(24) tablet TAKE 1 TABLET EVERY DAY *SKIP PLACEBO 84 tablet 3  . simvastatin (ZOCOR) 10 MG tablet Take 1 tablet (10 mg total) by mouth at bedtime. 90 tablet 0   No current facility-administered medications on file prior to visit.    Review of Systems:  As per HPI- otherwise negative.   Physical Examination: Vitals:   03/14/20 0844  BP: 112/80  Pulse: 62  Resp: 16  SpO2: 99%   Vitals:   03/14/20 0844  Weight: 120 lb (54.4 kg)  Height: 4\' 7"  (1.397 m)   Body mass index is 27.89 kg/m. Ideal Body Weight: Weight in (lb) to have BMI = 25: 107.3  GEN: no acute distress. Short stature, mild overweight  HEENT: Atraumatic, Normocephalic.  Ears and  Nose: No external deformity. CV: RRR, No M/G/R. No JVD. No thrill. No extra heart sounds. PULM: CTA B, no wheezes, crackles, rhonchi. No retractions. No resp. distress. No accessory muscle use. ABD: S, NT, ND, +BS. No rebound. No HSM. EXTR: No c/c/e PSYCH: Normally interactive. Conversant.   Wt Readings from Last 3 Encounters:  03/14/20 120 lb (54.4 kg)  03/11/19 128 lb (58.1 kg)  03/03/18 118 lb (53.5 kg)    Assessment and Plan: Physical exam  Screening for deficiency anemia - Plan: CBC  Dyslipidemia - Plan: Lipid panel, simvastatin (ZOCOR) 10 MG tablet  Screening for diabetes mellitus - Plan:  Comprehensive metabolic panel, Hemoglobin A1c  Screening for thyroid disorder - Plan: TSH  Fatigue, unspecified type - Plan: VITAMIN D 25 Hydroxy (Vit-D Deficiency, Fractures)  Encounter for hepatitis C screening test for low risk patient - Plan: Hepatitis C antibody  Encounter for surveillance of contraceptive pills - Plan: Norethindrone Acetate-Ethinyl Estrad-FE (BLISOVI 24 FE) 1-20 MG-MCG(24) tablet  Need for influenza vaccination - Plan: Flu Vaccine QUAD 36+ mos IM Patient today for physical exam.  Routine labs are pending as above Refilled oral contraceptive pill Flu vaccine given Refilled cholesterol medication Encouraged healthy diet and exercise routine Patient is very petite which makes maintaining her weight somewhat more difficult  Will plan further follow- up pending labs.  This visit occurred during the SARS-CoV-2 public health emergency.  Safety protocols were in place, including screening questions prior to the visit, additional usage of staff PPE, and extensive cleaning of exam room while observing appropriate contact time as indicated for disinfecting solutions.    Signed Abbe Amsterdam, MD  Received her labs as below, message to patient  Results for orders placed or performed in visit on 03/14/20  CBC  Result Value Ref Range   WBC 8.0 4.0 - 10.5 K/uL   RBC 4.71 3.87 - 5.11 Mil/uL   Platelets 264.0 150.0 - 400.0 K/uL   Hemoglobin 13.6 12.0 - 15.0 g/dL   HCT 37.1 06.2 - 69.4 %   MCV 88.3 78.0 - 100.0 fl   MCHC 32.7 30.0 - 36.0 g/dL   RDW 85.4 62.7 - 03.5 %  Comprehensive metabolic panel  Result Value Ref Range   Sodium 140 135 - 145 mEq/L   Potassium 4.3 3.5 - 5.1 mEq/L   Chloride 108 96 - 112 mEq/L   CO2 25 19 - 32 mEq/L   Glucose, Bld 91 70 - 99 mg/dL   BUN 11 6 - 23 mg/dL   Creatinine, Ser 0.09 0.40 - 1.20 mg/dL   Total Bilirubin 0.6 0.2 - 1.2 mg/dL   Alkaline Phosphatase 64 39 - 117 U/L   AST 15 0 - 37 U/L   ALT 28 0 - 35 U/L   Total Protein  6.7 6.0 - 8.3 g/dL   Albumin 4.2 3.5 - 5.2 g/dL   GFR 381.82 >99.37 mL/min   Calcium 9.4 8.4 - 10.5 mg/dL  Hemoglobin J6R  Result Value Ref Range   Hgb A1c MFr Bld 5.3 4.6 - 6.5 %  Lipid panel  Result Value Ref Range   Cholesterol 198 0 - 200 mg/dL   Triglycerides 67.8 0.0 - 149.0 mg/dL   HDL 93.81 (L) >01.75 mg/dL   VLDL 10.2 0.0 - 58.5 mg/dL   LDL Cholesterol 277 (H) 0 - 99 mg/dL   Total CHOL/HDL Ratio 6    NonHDL 164.44   TSH  Result Value Ref Range   TSH 1.55 0.35 - 4.50 uIU/mL  VITAMIN D 25 Hydroxy (Vit-D Deficiency, Fractures)  Result Value Ref Range   VITD 25.70 (L) 30.00 - 100.00 ng/mL

## 2020-03-14 ENCOUNTER — Encounter: Payer: Self-pay | Admitting: Family Medicine

## 2020-03-14 ENCOUNTER — Ambulatory Visit (INDEPENDENT_AMBULATORY_CARE_PROVIDER_SITE_OTHER): Payer: BC Managed Care – PPO | Admitting: Family Medicine

## 2020-03-14 ENCOUNTER — Other Ambulatory Visit: Payer: Self-pay

## 2020-03-14 VITALS — BP 112/80 | HR 62 | Resp 16 | Ht <= 58 in | Wt 120.0 lb

## 2020-03-14 DIAGNOSIS — R5383 Other fatigue: Secondary | ICD-10-CM

## 2020-03-14 DIAGNOSIS — Z1159 Encounter for screening for other viral diseases: Secondary | ICD-10-CM

## 2020-03-14 DIAGNOSIS — Z131 Encounter for screening for diabetes mellitus: Secondary | ICD-10-CM | POA: Diagnosis not present

## 2020-03-14 DIAGNOSIS — Z130 Encounter for screening for diseases of the blood and blood-forming organs and certain disorders involving the immune mechanism: Secondary | ICD-10-CM | POA: Diagnosis not present

## 2020-03-14 DIAGNOSIS — E785 Hyperlipidemia, unspecified: Secondary | ICD-10-CM

## 2020-03-14 DIAGNOSIS — Z23 Encounter for immunization: Secondary | ICD-10-CM

## 2020-03-14 DIAGNOSIS — Z0000 Encounter for general adult medical examination without abnormal findings: Secondary | ICD-10-CM

## 2020-03-14 DIAGNOSIS — Z3041 Encounter for surveillance of contraceptive pills: Secondary | ICD-10-CM

## 2020-03-14 DIAGNOSIS — Z1329 Encounter for screening for other suspected endocrine disorder: Secondary | ICD-10-CM

## 2020-03-14 LAB — CBC
HCT: 41.6 % (ref 36.0–46.0)
Hemoglobin: 13.6 g/dL (ref 12.0–15.0)
MCHC: 32.7 g/dL (ref 30.0–36.0)
MCV: 88.3 fl (ref 78.0–100.0)
Platelets: 264 10*3/uL (ref 150.0–400.0)
RBC: 4.71 Mil/uL (ref 3.87–5.11)
RDW: 13.6 % (ref 11.5–15.5)
WBC: 8 10*3/uL (ref 4.0–10.5)

## 2020-03-14 LAB — LIPID PANEL
Cholesterol: 198 mg/dL (ref 0–200)
HDL: 33.8 mg/dL — ABNORMAL LOW (ref >39.00)
LDL Cholesterol: 149 mg/dL — ABNORMAL HIGH (ref 0–99)
NonHDL: 164.44
Total CHOL/HDL Ratio: 6
Triglycerides: 76 mg/dL (ref 0.0–149.0)
VLDL: 15.2 mg/dL (ref 0.0–40.0)

## 2020-03-14 LAB — COMPREHENSIVE METABOLIC PANEL
ALT: 28 U/L (ref 0–35)
AST: 15 U/L (ref 0–37)
Albumin: 4.2 g/dL (ref 3.5–5.2)
Alkaline Phosphatase: 64 U/L (ref 39–117)
BUN: 11 mg/dL (ref 6–23)
CO2: 25 mEq/L (ref 19–32)
Calcium: 9.4 mg/dL (ref 8.4–10.5)
Chloride: 108 mEq/L (ref 96–112)
Creatinine, Ser: 0.74 mg/dL (ref 0.40–1.20)
GFR: 103.3 mL/min (ref >60.00)
Glucose, Bld: 91 mg/dL (ref 70–99)
Potassium: 4.3 mEq/L (ref 3.5–5.1)
Sodium: 140 mEq/L (ref 135–145)
Total Bilirubin: 0.6 mg/dL (ref 0.2–1.2)
Total Protein: 6.7 g/dL (ref 6.0–8.3)

## 2020-03-14 LAB — TSH: TSH: 1.55 u[IU]/mL (ref 0.35–4.50)

## 2020-03-14 LAB — VITAMIN D 25 HYDROXY (VIT D DEFICIENCY, FRACTURES): VITD: 25.7 ng/mL — ABNORMAL LOW (ref 30.00–100.00)

## 2020-03-14 LAB — HEMOGLOBIN A1C: Hgb A1c MFr Bld: 5.3 % (ref 4.6–6.5)

## 2020-03-14 MED ORDER — BLISOVI 24 FE 1-20 MG-MCG(24) PO TABS: ORAL_TABLET | ORAL | 3 refills | Status: DC

## 2020-03-14 MED ORDER — SIMVASTATIN 10 MG PO TABS: 10.0000 mg | ORAL_TABLET | Freq: Every day | ORAL | 0 refills | Status: DC

## 2020-03-15 LAB — HEPATITIS C ANTIBODY
Hepatitis C Ab: NONREACTIVE
SIGNAL TO CUT-OFF: 0.01 (ref <1.00)

## 2020-03-16 ENCOUNTER — Encounter: Payer: Self-pay | Admitting: Family Medicine

## 2020-05-09 ENCOUNTER — Encounter: Payer: Self-pay | Admitting: Family Medicine

## 2020-06-06 ENCOUNTER — Other Ambulatory Visit: Payer: Self-pay | Admitting: Family Medicine

## 2020-06-06 DIAGNOSIS — E785 Hyperlipidemia, unspecified: Secondary | ICD-10-CM

## 2020-08-11 ENCOUNTER — Other Ambulatory Visit: Payer: Self-pay | Admitting: Family Medicine

## 2020-08-11 DIAGNOSIS — E785 Hyperlipidemia, unspecified: Secondary | ICD-10-CM

## 2020-10-04 IMAGING — MG DIGITAL DIAGNOSTIC BILATERAL MAMMOGRAM WITH TOMO AND CAD
6 of 10 series · 6 of 30 positions shown · non-contrast
Comparison: None.

CLINICAL DATA: 37-year-old with a possible palpable lump in the
UPPER OUTER QUADRANT of the LEFT breast on recent clinical
examination.This is the patient's initial baseline mammogram.

Family history of breast cancer in her paternal grandmother and in 2
maternal aunts. Patient states that she was previously tested for
the BRCA gene and is negative.
EXAM:
DIGITAL DIAGNOSTIC BILATERAL MAMMOGRAM WITH CAD AND TOMO
ULTRASOUND LEFT BREAST

[R MLO synth-2D]
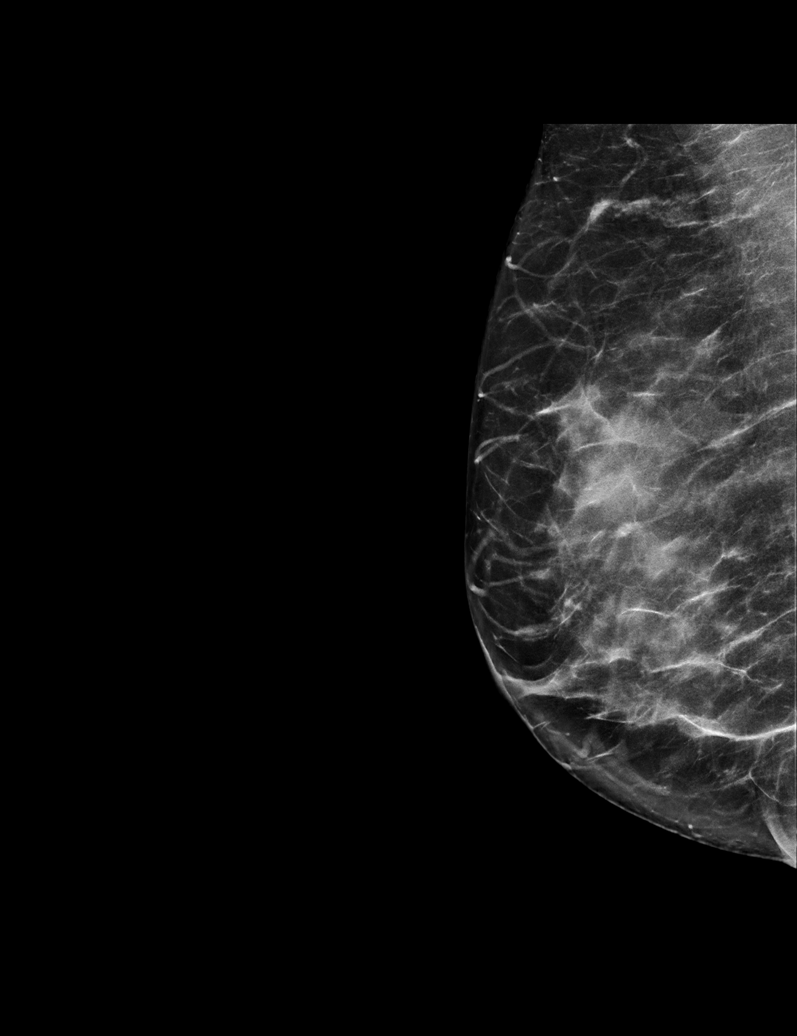

[R CC synth-2D]
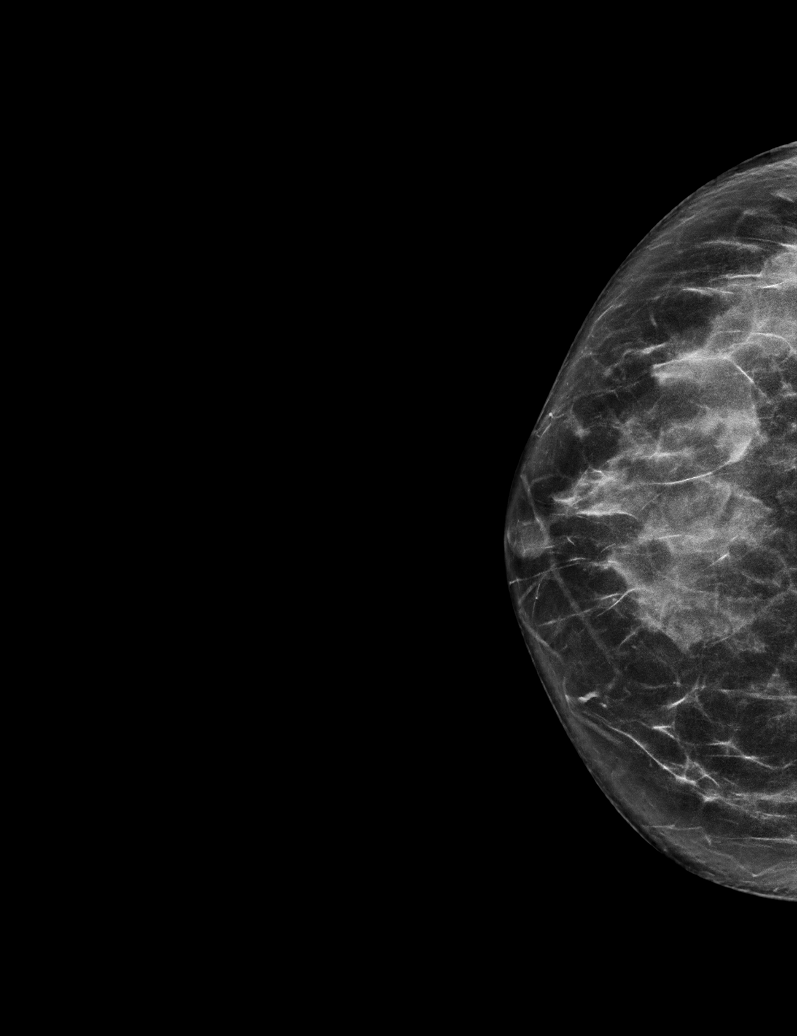

[L CC synth-2D]
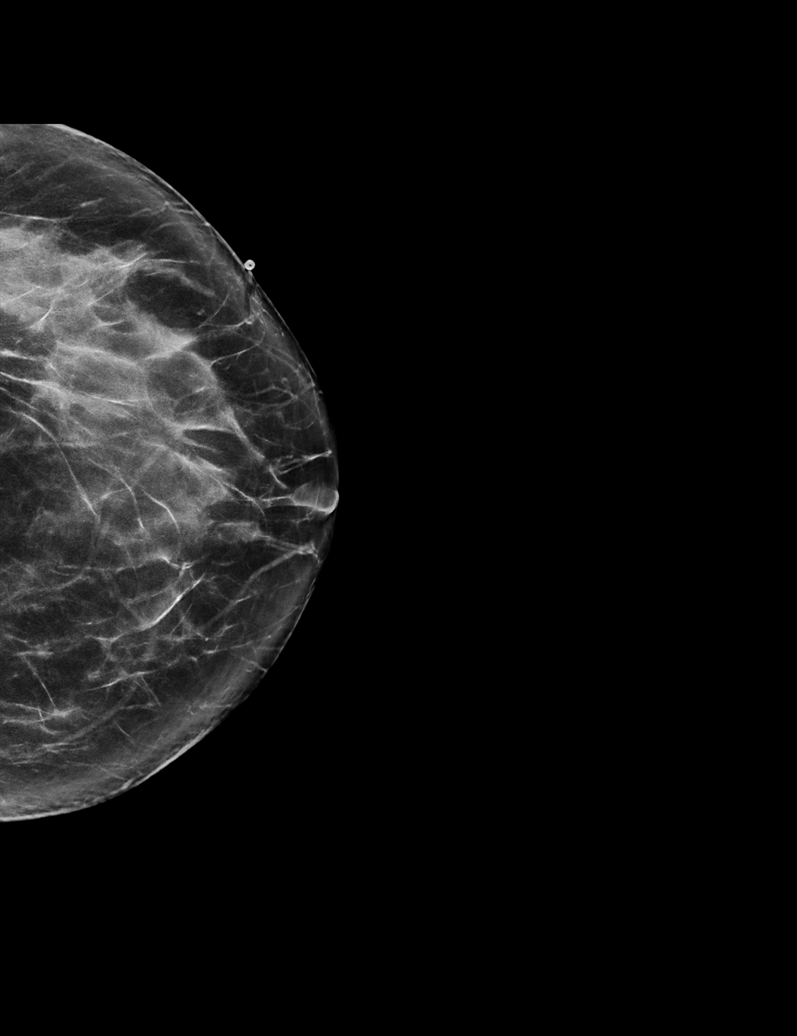

[L TAN synth-2D]
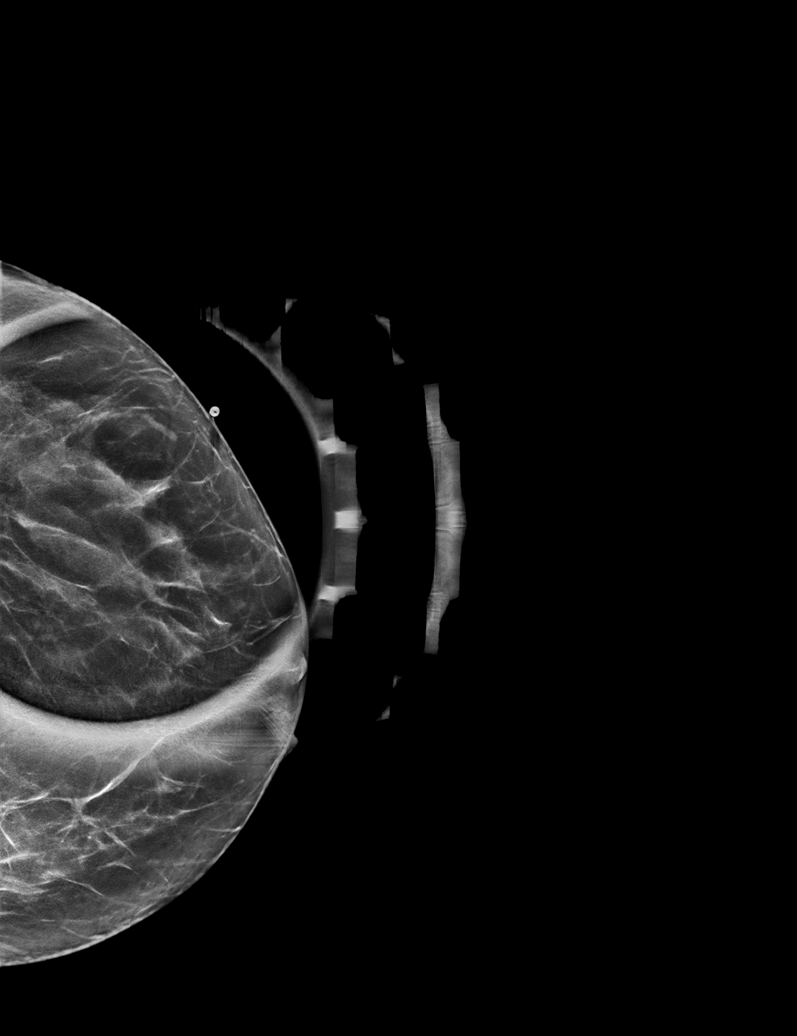

[L MLO synth-2D]
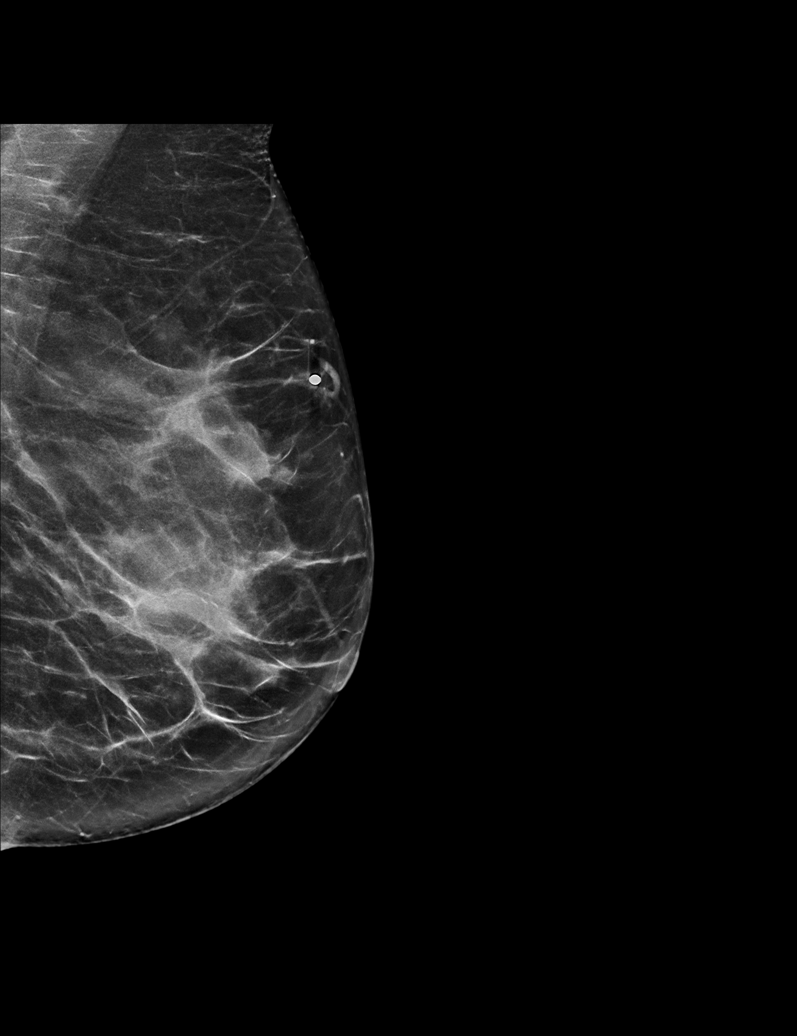

[L MLO tomo · tomo slice 34/67.0]
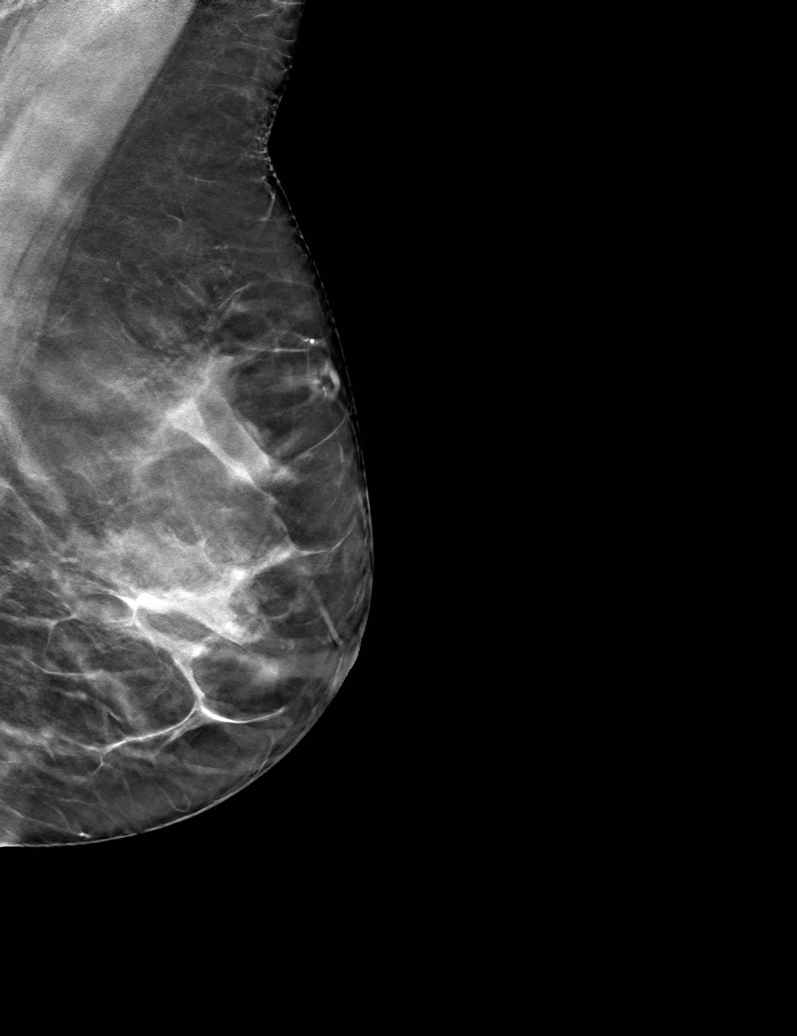

[6 of 30 positions shown; findings below may reference images not displayed]

ACR Breast Density Category d: The breast tissue is extremely dense,
which lowers the sensitivity of mammography.
FINDINGS: Standard 2D and tomosynthesis full field CC and MLO views of both
breasts were obtained. Tomosynthesis and synthesized spot
compression tangential view of the area of concern in the LEFT
breast was also obtained.

No mammographic abnormality in the area of palpable concern in the
UPPER OUTER QUADRANT of the LEFT breast. Normal dense fibroglandular
tissue is present in this location.

No findings suspicious for malignancy in either breast.

Mammographic images were processed with CAD.

On physical exam, there is a mobile palpable pea-sized lump in the
UPPER OUTER periareolar LEFT breast.

Targeted LEFT breast ultrasound is performed, showing a benign
bilobed simple cyst at the 2 o'clock position approximately 4 cm
from the nipple within dense fibroglandular tissue, measuring
approximately 3 x 4 x 9 mm, corresponding to the palpable concern.
No suspicious solid mass or abnormal acoustic shadowing is
identified.
IMPRESSION: 1. No mammographic or sonographic evidence of malignancy involving
the LEFT breast.
2. No mammographic evidence of malignancy involving the RIGHT
breast.
3. Benign simple cyst in the UPPER OUTER periareolar LEFT breast
which accounts for the palpable concern.

RECOMMENDATION:
Screening mammogram at age 42 unless there are persistent or
intervening clinical concerns. (Code:R9-K-W9A)

I have discussed the findings and recommendations with the patient.
Results were also provided in writing at the conclusion of the
visit. If applicable, a reminder letter will be sent to the patient
regarding the next appointment.

BI-RADS CATEGORY  2: Benign.

## 2020-11-26 ENCOUNTER — Other Ambulatory Visit: Payer: Self-pay | Admitting: Family Medicine

## 2020-11-26 DIAGNOSIS — Z3041 Encounter for surveillance of contraceptive pills: Secondary | ICD-10-CM

## 2020-12-02 ENCOUNTER — Ambulatory Visit (INDEPENDENT_AMBULATORY_CARE_PROVIDER_SITE_OTHER): Payer: BC Managed Care – PPO | Admitting: Family

## 2020-12-02 ENCOUNTER — Other Ambulatory Visit: Payer: Self-pay

## 2020-12-02 ENCOUNTER — Encounter: Payer: Self-pay | Admitting: Family

## 2020-12-02 VITALS — BP 129/89 | HR 89 | Temp 99.0°F | Ht <= 58 in | Wt 126.8 lb

## 2020-12-02 DIAGNOSIS — K219 Gastro-esophageal reflux disease without esophagitis: Secondary | ICD-10-CM | POA: Diagnosis not present

## 2020-12-02 MED ORDER — OMEPRAZOLE MAGNESIUM 20 MG PO TBEC: 20.0000 mg | DELAYED_RELEASE_TABLET | Freq: Every day | ORAL | Status: DC

## 2020-12-02 NOTE — Progress Notes (Signed)
Subjective:     Patient ID: Tara Lam, female    DOB: 1980-10-18, 40 y.o.   MRN: [id number]  Chief Complaint  Patient presents with   forceful swallowing    Started yesterday. Came on all of a sudden. Can eat a drink okay. Feels like something is in the throat.     HPI Patient is in today for concerns of three day h/o mild nasal congestion, mild sore throat, and states she is constantly clearing her throat. She did feel some tightness at the base of her throat late last night which has since resolved. For lunch she ate a cheeseburger and fries which is not abnormal for her. She does cough, which is mainly to clear her throat, dry non productive cough. No sob or chest pain at current.   She has not taken any medication for these symptoms. Used a cough drop which helped at the time. No n/v.   Denies ear pain. Denies sinus pressure. No fever.  Health Maintenance Due  Topic Date Due   COVID-19 Vaccine (4 - Booster for Moderna series) 02/19/2020   INFLUENZA VACCINE  08/22/2020    Past Medical History:  Diagnosis Date   Chicken pox     Past Surgical History:  Procedure Laterality Date   CESAREAN SECTION  04/03/2010   TONSILLECTOMY     WISDOM TOOTH EXTRACTION      Family History  Problem Relation Age of Onset   Cancer Maternal Grandmother    Diabetes Maternal Grandmother    Bladder Cancer Maternal Grandfather    Breast cancer Paternal Grandmother    Cancer Paternal Grandfather     Social History   Socioeconomic History   Marital status: Married    Spouse name: Not on file   Number of children: Not on file   Years of education: Not on file   Highest education level: Not on file  Occupational History   Not on file  Tobacco Use   Smoking status: Never   Smokeless tobacco: Never  Substance and Sexual Activity   Alcohol use: Yes    Comment: social drinker   Drug use: No   Sexual activity: Not on file  Other Topics Concern   Not on file  Social History  Narrative   Not on file   Social Determinants of Health   Financial Resource Strain: Not on file  Food Insecurity: Not on file  Transportation Needs: Not on file  Physical Activity: Not on file  Stress: Not on file  Social Connections: Not on file  Intimate Partner Violence: Not on file    Outpatient Medications Prior to Visit  Medication Sig Dispense Refill   HAILEY 24 FE 1-20 MG-MCG(24) tablet TAKE ONE DAILY, SKIP PLACEBO 84 tablet 3   simvastatin (ZOCOR) 10 MG tablet Take 1 tablet (10 mg total) by mouth at bedtime. 90 tablet 1   No facility-administered medications prior to visit.    No Known Allergies  Review of Systems  Constitutional:  Negative for chills and fever.  HENT:  Positive for sore throat (mild, mainly with swallowing). Negative for congestion, ear discharge, ear pain, hearing loss and sinus pain.   Eyes:  Negative for discharge.  Respiratory:  Positive for cough (dry non productive). Negative for wheezing.   Cardiovascular:  Negative for chest pain.  Gastrointestinal:  Negative for abdominal pain, nausea and vomiting.      Objective:    Physical Exam Vitals reviewed.  Constitutional:      General:  She is not in acute distress.    Appearance: Normal appearance. She is obese. She is not ill-appearing.  HENT:     Right Ear: Tympanic membrane normal.     Left Ear: Tympanic membrane normal.     Nose: Rhinorrhea present.     Right Turbinates: Not enlarged or swollen.     Left Turbinates: Not enlarged or swollen.     Right Sinus: No maxillary sinus tenderness or frontal sinus tenderness.     Left Sinus: No maxillary sinus tenderness or frontal sinus tenderness.     Mouth/Throat:     Lips: Pink. No lesions.     Mouth: Mucous membranes are moist.     Pharynx: Oropharynx is clear.  Neck:     Thyroid: No thyroid mass.  Cardiovascular:     Rate and Rhythm: Normal rate.  Pulmonary:     Effort: Pulmonary effort is normal.     Breath sounds: Normal breath  sounds.  Lymphadenopathy:     Cervical:     Right cervical: No superficial cervical adenopathy. Neurological:     Mental Status: She is alert.    BP 129/89   Pulse 89   Temp 99 F (37.2 C) (Oral)   Ht 4\' 7"  (1.397 m)   Wt 126 lb 12.8 oz (57.5 kg)   LMP 11/17/2020   SpO2 99%   BMI 29.47 kg/m  Wt Readings from Last 3 Encounters:  12/02/20 126 lb 12.8 oz (57.5 kg)  03/14/20 120 lb (54.4 kg)  03/11/19 128 lb (58.1 kg)       Assessment & Plan:   Problem List Items Addressed This Visit       Digestive   Gastroesophageal reflux disease without esophagitis - Primary    Advised pt to decreased spicy fatty foods, caffeine and chocolate. Suggested pt start omeprazole 20 mg one po qd for the next two weeks. If no improvement in symptoms advised pt to f/u with pcp       Relevant Medications   omeprazole (PRILOSEC OTC) 20 MG tablet    I am having Carlise Pettigrew start on omeprazole. I am also having her maintain her simvastatin and Hailey 24 Fe.  Meds ordered this encounter  Medications   omeprazole (PRILOSEC OTC) 20 MG tablet    Sig: Take 1 tablet (20 mg total) by mouth daily.

## 2020-12-02 NOTE — Assessment & Plan Note (Signed)
Advised pt to decreased spicy fatty foods, caffeine and chocolate. Suggested pt start omeprazole 20 mg one po qd for the next two weeks. If no improvement in symptoms advised pt to f/u with pcp

## 2020-12-02 NOTE — Patient Instructions (Addendum)
  decrease fried, spicy, fatty foods, caffeine and chocolate to not exacerbate reflux symptoms. Suggest starting otc prilosec 20 mg once a day for the next two weeks. If no improvement in symptoms please follow up.   It was a pleasure seeing you today.

## 2021-02-07 ENCOUNTER — Other Ambulatory Visit: Payer: Self-pay | Admitting: Family Medicine

## 2021-02-07 DIAGNOSIS — E785 Hyperlipidemia, unspecified: Secondary | ICD-10-CM

## 2021-03-07 NOTE — Patient Instructions (Addendum)
Good to see you today- I will be in touch with your labs and pap asap and will send in your insurance form  Recommend updated covid vaccine if not done already  Please stop by imaging to set up your thyroid US/ hopefully they can do today   Try to work on getting more exercise day to day

## 2021-03-07 NOTE — Progress Notes (Signed)
[facility] [address] [place], [place] [address] [phone]  Date:  03/15/2021   Name:  Tara Lam   DOB:  11-29-1980   MRN:  [id number]  PCP:  Pearline Cables, MD    Chief Complaint: Annual Exam (Concerns/ questions: would like to update pap today/Flu shot: yes/)   History of Present Illness:  Tara Lam is a 41 y.o. very pleasant female patient who presents with the following:  Pt seen today for a CPE Last visit with myself about one year ago  She is married and has 4 kids in her blended family ; they are 64, 87,12,39 yo They are all doing well  Their schedule is busy. Harder for her to get exercise   She works for [employer] in [employer] booster- recommend  Flu vaccine- give today  Mammo 2020- RECOMMENDATION: Screening mammogram at age 37 unless there are persistent or intervening clinical concerns.  Pap needs to be updated - she had ASCUS with negative HPV 02/2018  Labs one year ago   Thyroid US 2/21: IMPRESSION: 1. Normal-sized thyroid with small left nodules. None meets criteria for biopsy. 2. Recommend annual/biennial ultrasound follow-up of nodules as above, until stability x5 years confirmed.   Patient Active Problem List   Diagnosis Date Noted   Gastroesophageal reflux disease without esophagitis 12/02/2020   Prediabetes 03/11/2019   Dyslipidemia 03/11/2019   Mild hearing loss of right ear 02/02/2016   Small stature 02/02/2016    Past Medical History:  Diagnosis Date   Chicken pox     Past Surgical History:  Procedure Laterality Date   CESAREAN SECTION  04/03/2010   TONSILLECTOMY     WISDOM TOOTH EXTRACTION      Social History   Tobacco Use   Smoking status: Never   Smokeless tobacco: Never  Substance Use Topics   Alcohol use: Yes    Comment: social drinker   Drug use: No    Family History  Problem Relation Age of Onset   Cancer Maternal  Grandmother    Diabetes Maternal Grandmother    Bladder Cancer Maternal Grandfather    Breast cancer Paternal Grandmother    Cancer Paternal Grandfather     No Known Allergies  Medication list has been reviewed and updated.  Current Outpatient Medications on File Prior to Visit  Medication Sig Dispense Refill   HAILEY 24 FE 1-20 MG-MCG(24) tablet TAKE ONE DAILY, SKIP PLACEBO 84 tablet 3   simvastatin (ZOCOR) 10 MG tablet TAKE 1 TABLET BY MOUTH EVERYDAY AT BEDTIME 90 tablet 1   No current facility-administered medications on file prior to visit.    Review of Systems:  As per HPI- otherwise negative.   Physical Examination: Vitals:   03/15/21 0812  BP: 108/60  Pulse: 68  Resp: 18  Temp: 98.2 F (36.8 C)  SpO2: 99%   Vitals:   03/15/21 0812  Weight: 125 lb (56.7 kg)  Height: 4\' 7"  (1.397 m)   Body mass index is 29.05 kg/m. Ideal Body Weight: Weight in (lb) to have BMI = 25: 107.3  GEN: no acute distress.  Petite build, overweight HEENT: Atraumatic, Normocephalic.  Bilateral TM wnl, oropharynx normal.  PEERL,EOMI.   Mild thyroid enlargement Ears and Nose: No external deformity. CV: RRR, No M/G/R. No JVD. No thrill. No extra heart sounds. PULM: CTA B, no wheezes, crackles, rhonchi. No retractions. No resp. distress. No accessory  muscle use. ABD: S, NT, ND, +BS. No rebound. No HSM. EXTR: No c/c/e PSYCH: Normally interactive. Conversant.  Waist 30.5 Normal pelvic exam today   Assessment and Plan: Physical exam  Dyslipidemia - Plan: Lipid panel  Screening for diabetes mellitus - Plan: Comprehensive metabolic panel, Hemoglobin A1c  Screening for deficiency anemia - Plan: CBC  Screening for thyroid disorder - Plan: TSH  Fatigue, unspecified type - Plan: TSH, VITAMIN D 25 Hydroxy (Vit-D Deficiency, Fractures)  Screening for cervical cancer - Plan: Cytology - PAP  Need for influenza vaccination - Plan: Flu Vaccine QUAD 6+ mos PF IM (Fluarix Quad  PF)  Thyroid enlargement - Plan: US THYROID  Physical exam today- encouraged healthy diet and exercise routine Will plan further follow- up pending labs. Order thyroid US follow-up   Signed Abbe Amsterdam, MD  Received patient labs and thyroid ultrasound as follows, message to patient  Results for orders placed or performed in visit on 03/15/21  CBC  Result Value Ref Range   WBC 9.0 4.0 - 10.5 K/uL   RBC 4.56 3.87 - 5.11 Mil/uL   Platelets 268.0 150.0 - 400.0 K/uL   Hemoglobin 13.4 12.0 - 15.0 g/dL   HCT 27.2 53.6 - 64.4 %   MCV 87.9 78.0 - 100.0 fl   MCHC 33.3 30.0 - 36.0 g/dL   RDW 03.4 74.2 - 59.5 %  Comprehensive metabolic panel  Result Value Ref Range   Sodium 138 135 - 145 mEq/L   Potassium 4.2 3.5 - 5.1 mEq/L   Chloride 105 96 - 112 mEq/L   CO2 29 19 - 32 mEq/L   Glucose, Bld 93 70 - 99 mg/dL   BUN 20 6 - 23 mg/dL   Creatinine, Ser 6.38 0.40 - 1.20 mg/dL   Total Bilirubin 0.7 0.2 - 1.2 mg/dL   Alkaline Phosphatase 62 39 - 117 U/L   AST 12 0 - 37 U/L   ALT 10 0 - 35 U/L   Total Protein 7.1 6.0 - 8.3 g/dL   Albumin 4.4 3.5 - 5.2 g/dL   GFR 75.64 >33.29 mL/min   Calcium 9.2 8.4 - 10.5 mg/dL  Hemoglobin J1O  Result Value Ref Range   Hgb A1c MFr Bld 5.5 4.6 - 6.5 %  Lipid panel  Result Value Ref Range   Cholesterol 239 (H) 0 - 200 mg/dL   Triglycerides 841.6 0.0 - 149.0 mg/dL   HDL 60.63 (L) >01.60 mg/dL   VLDL 10.9 0.0 - 32.3 mg/dL   LDL Cholesterol 557 (H) 0 - 99 mg/dL   Total CHOL/HDL Ratio 6    NonHDL 200.78   TSH  Result Value Ref Range   TSH 1.78 0.35 - 5.50 uIU/mL  VITAMIN D 25 Hydroxy (Vit-D Deficiency, Fractures)  Result Value Ref Range   VITD 27.33 (L) 30.00 - 100.00 ng/mL    US THYROID  Result Date: 03/15/2021 CLINICAL DATA:  Prior ultrasound follow-up. EXAM: THYROID ULTRASOUND TECHNIQUE: Ultrasound examination of the thyroid gland and adjacent soft tissues was performed. COMPARISON:  03/16/2019 FINDINGS: Parenchymal Echotexture: Moderately  heterogenous Isthmus: 0.6 cm, previously 0.8 cm Right lobe: 4.8 x 1.4 x 1.7 cm, previously 4.3 x 1.5 x 1.6 cm Left lobe: 4.8 x 1.8 x 2.0 cm, previously 4.5 x 1.8 x 1.7 cm _________________________________________________________ Estimated total number of nodules >/= 1 cm: 1 Number of spongiform nodules >/=  2 cm not described below (TR1): 0 Number of mixed cystic and solid nodules >/= 1.5 cm not described below (TR2): 0  _________________________________________________________ Nodule # 1, previously labeled 2: Prior biopsy: No Location: Left; Mid Maximum size: 0.8 cm; Other 2 dimensions: 0.8 x 0.8 cm, previously, 1.0 x 0.4 x 0.6 cm Composition: spongiform (0) Echogenicity: hypoechoic (2) Shape: not taller-than-wide (0) Margins: ill-defined (0) Echogenic foci: none (0) ACR TI-RADS total points: 2. ACR TI-RADS risk category:  TR2 (2 points). Significant change in size (>/= 20% in two dimensions and minimal increase of 2 mm): No Change in features: Yes Change in ACR TI-RADS risk category: Yes ACR TI-RADS recommendations: This nodule does NOT meet TI-RADS criteria for biopsy or dedicated follow-up. _________________________________________________________ Nodule # 3, previously labeled 1: Prior biopsy: No Location: Left; Mid Maximum size: 1.0 cm; Other 2 dimensions: 0.5 x 0.6 cm, previously, 1.2 x 0.5 x 0.4 cm Composition: solid/almost completely solid (2) Echogenicity: hypoechoic (2) Shape: not taller-than-wide (0) Margins: ill-defined (0) Echogenic foci: none (0) ACR TI-RADS total points: 4. ACR TI-RADS risk category:  TR4 (4-6 points). Significant change in size (>/= 20% in two dimensions and minimal increase of 2 mm): No Change in features: No Change in ACR TI-RADS risk category: No ACR TI-RADS recommendations: *Given size (>/= 1 - 1.4 cm) and appearance, a follow-up ultrasound in 1 year should be considered based on TI-RADS criteria. _________________________________________________________ Nodule # 4: Prior  biopsy: No Location: Left; Inferior Maximum size: 0.7 cm; Other 2 dimensions: 0.5 x 0.4 cm, previously, 0.4 x 0.30.5 cm Composition: solid/almost completely solid (2) Echogenicity: hyperechoic (1) Shape: not taller-than-wide (0) Margins: smooth (0) Echogenic foci: none (0) ACR TI-RADS total points: 3. ACR TI-RADS risk category:  TR3 (3 points). Significant change in size (>/= 20% in two dimensions and minimal increase of 2 mm): No Change in features: No Change in ACR TI-RADS risk category: No ACR TI-RADS recommendations: Given size (<1.4 cm) and appearance, this nodule does NOT meet TI-RADS criteria for biopsy or dedicated follow-up. _________________________________________________________ No cervical lymphadenopathy. IMPRESSION: 1. Similar appearing multinodular goiter. 2. Solid nodule in the left mid thyroid (labeled 3, previously labeled 1) meets criteria (TI-RADS category 4) for 1 year ultrasound surveillance. This study marks 2 years stability. 3. Spongiform nodule in the left mid thyroid (labeled 1, previously labeled 2) demonstrates interval spongiform development is therefore declared benign (TI-RADS category 2). No additional follow-up is warranted. The above is in keeping with the ACR TI-RADS recommendations - J Am Coll Radiol 2017;14:587-595. Marliss Coots, MD [facility] Electronically Signed   By: Marliss Coots M.D.   On: 03/15/2021 14:55

## 2021-03-15 ENCOUNTER — Ambulatory Visit (INDEPENDENT_AMBULATORY_CARE_PROVIDER_SITE_OTHER): Payer: BC Managed Care – PPO | Admitting: Family Medicine

## 2021-03-15 ENCOUNTER — Other Ambulatory Visit: Payer: Self-pay

## 2021-03-15 ENCOUNTER — Ambulatory Visit (HOSPITAL_BASED_OUTPATIENT_CLINIC_OR_DEPARTMENT_OTHER)
Admission: RE | Admit: 2021-03-15 | Discharge: 2021-03-15 | Disposition: A | Discharge status: Home / Self Care | Payer: BC Managed Care – PPO | Source: Ambulatory Visit | Attending: Family Medicine | Admitting: Family Medicine

## 2021-03-15 ENCOUNTER — Encounter: Payer: Self-pay | Admitting: Family Medicine

## 2021-03-15 ENCOUNTER — Other Ambulatory Visit (HOSPITAL_COMMUNITY)
Admission: RE | Admit: 2021-03-15 | Discharge: 2021-03-15 | Disposition: A | Discharge status: Home / Self Care | Payer: BC Managed Care – PPO | Source: Ambulatory Visit | Attending: Family Medicine | Admitting: Family Medicine

## 2021-03-15 VITALS — BP 108/60 | HR 68 | Temp 98.2°F | Resp 18 | Ht <= 58 in | Wt 125.0 lb

## 2021-03-15 DIAGNOSIS — Z130 Encounter for screening for diseases of the blood and blood-forming organs and certain disorders involving the immune mechanism: Secondary | ICD-10-CM

## 2021-03-15 DIAGNOSIS — Z1329 Encounter for screening for other suspected endocrine disorder: Secondary | ICD-10-CM | POA: Diagnosis not present

## 2021-03-15 DIAGNOSIS — E785 Hyperlipidemia, unspecified: Secondary | ICD-10-CM | POA: Diagnosis not present

## 2021-03-15 DIAGNOSIS — Z23 Encounter for immunization: Secondary | ICD-10-CM

## 2021-03-15 DIAGNOSIS — Z0000 Encounter for general adult medical examination without abnormal findings: Secondary | ICD-10-CM

## 2021-03-15 DIAGNOSIS — E049 Nontoxic goiter, unspecified: Secondary | ICD-10-CM | POA: Diagnosis not present

## 2021-03-15 DIAGNOSIS — Z124 Encounter for screening for malignant neoplasm of cervix: Secondary | ICD-10-CM

## 2021-03-15 DIAGNOSIS — R5383 Other fatigue: Secondary | ICD-10-CM

## 2021-03-15 DIAGNOSIS — Z131 Encounter for screening for diabetes mellitus: Secondary | ICD-10-CM

## 2021-03-15 DIAGNOSIS — E042 Nontoxic multinodular goiter: Secondary | ICD-10-CM | POA: Insufficient documentation

## 2021-03-15 LAB — LIPID PANEL
Cholesterol: 239 mg/dL — ABNORMAL HIGH (ref 0–200)
HDL: 38.2 mg/dL — ABNORMAL LOW (ref >39.00)
LDL Cholesterol: 178 mg/dL — ABNORMAL HIGH (ref 0–99)
NonHDL: 200.78
Total CHOL/HDL Ratio: 6
Triglycerides: 115 mg/dL (ref 0.0–149.0)
VLDL: 23 mg/dL (ref 0.0–40.0)

## 2021-03-15 LAB — CBC
HCT: 40.1 % (ref 36.0–46.0)
Hemoglobin: 13.4 g/dL (ref 12.0–15.0)
MCHC: 33.3 g/dL (ref 30.0–36.0)
MCV: 87.9 fl (ref 78.0–100.0)
Platelets: 268 10*3/uL (ref 150.0–400.0)
RBC: 4.56 Mil/uL (ref 3.87–5.11)
RDW: 13.3 % (ref 11.5–15.5)
WBC: 9 10*3/uL (ref 4.0–10.5)

## 2021-03-15 LAB — COMPREHENSIVE METABOLIC PANEL
ALT: 10 U/L (ref 0–35)
AST: 12 U/L (ref 0–37)
Albumin: 4.4 g/dL (ref 3.5–5.2)
Alkaline Phosphatase: 62 U/L (ref 39–117)
BUN: 20 mg/dL (ref 6–23)
CO2: 29 mEq/L (ref 19–32)
Calcium: 9.2 mg/dL (ref 8.4–10.5)
Chloride: 105 mEq/L (ref 96–112)
Creatinine, Ser: 0.78 mg/dL (ref 0.40–1.20)
GFR: 96.3 mL/min (ref >60.00)
Glucose, Bld: 93 mg/dL (ref 70–99)
Potassium: 4.2 mEq/L (ref 3.5–5.1)
Sodium: 138 mEq/L (ref 135–145)
Total Bilirubin: 0.7 mg/dL (ref 0.2–1.2)
Total Protein: 7.1 g/dL (ref 6.0–8.3)

## 2021-03-15 LAB — HEMOGLOBIN A1C: Hgb A1c MFr Bld: 5.5 % (ref 4.6–6.5)

## 2021-03-15 LAB — TSH: TSH: 1.78 u[IU]/mL (ref 0.35–5.50)

## 2021-03-15 LAB — VITAMIN D 25 HYDROXY (VIT D DEFICIENCY, FRACTURES): VITD: 27.33 ng/mL — ABNORMAL LOW (ref 30.00–100.00)

## 2021-03-17 ENCOUNTER — Encounter: Payer: Self-pay | Admitting: Family Medicine

## 2021-03-17 LAB — CYTOLOGY - PAP
Adequacy: ABSENT
Comment: NEGATIVE
Diagnosis: NEGATIVE
High risk HPV: NEGATIVE

## 2021-08-15 ENCOUNTER — Other Ambulatory Visit: Payer: Self-pay | Admitting: Family Medicine

## 2021-08-15 DIAGNOSIS — E785 Hyperlipidemia, unspecified: Secondary | ICD-10-CM

## 2021-10-13 IMAGING — US US THYROID
1 series · 13 of 25 positions shown · non-contrast
Comparison: None.

CLINICAL DATA: Thyromegaly

EXAM:
THYROID ULTRASOUND
TECHNIQUE: Ultrasound examination of the thyroid gland and adjacent soft
tissues was performed.

[Series 1: us thyroid · 31 acquisitions, 13 frames shown]
[im 1/31]
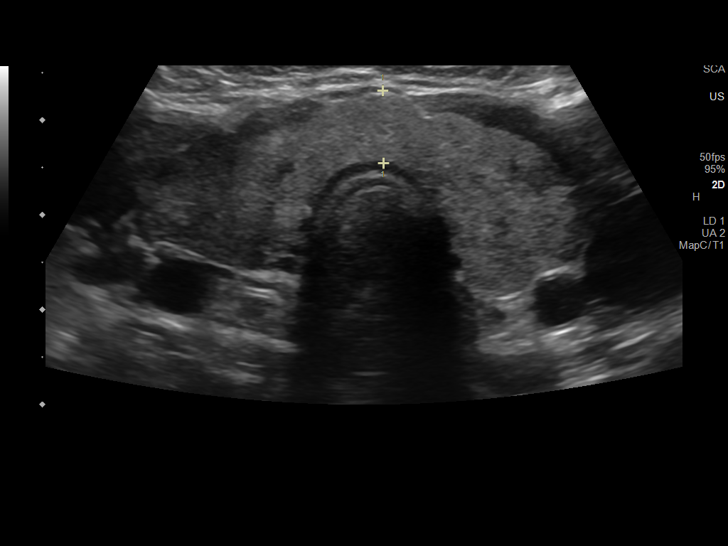
[im 3/31]
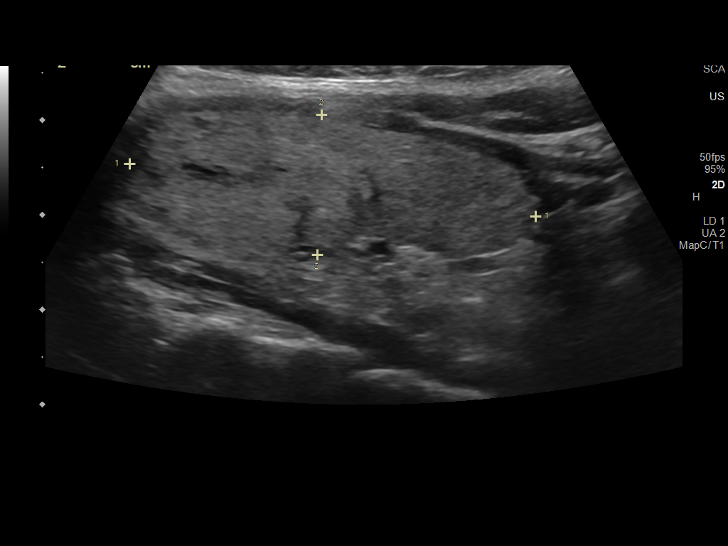
[im 6/31]
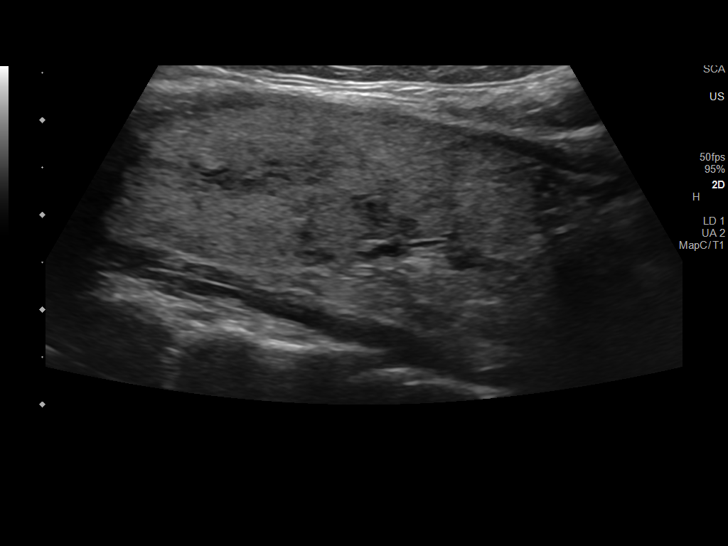
[im 8/31]
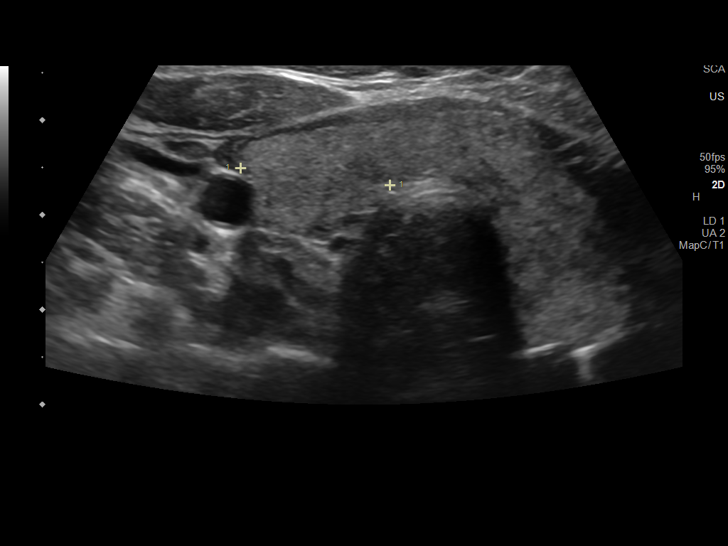
[im 11/31]
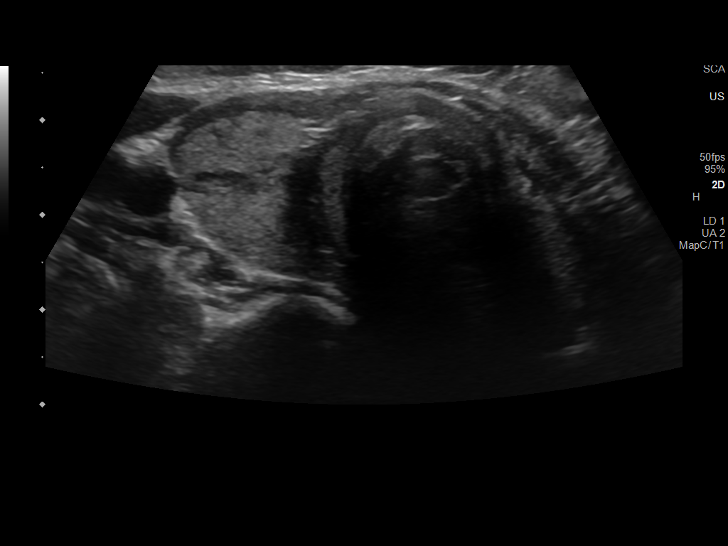
[im 13/31]
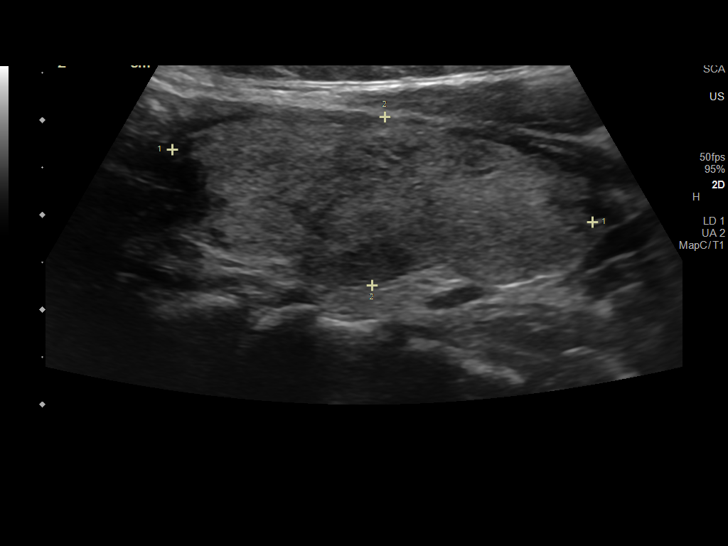
[im 16/31]
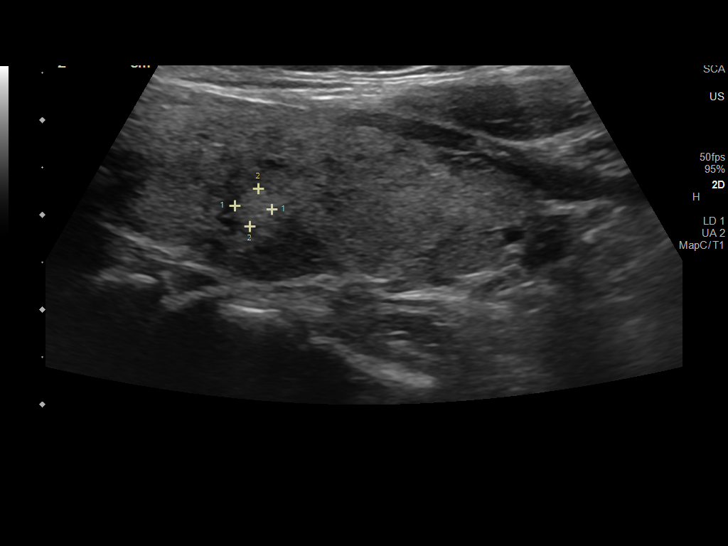
[im 18/31]
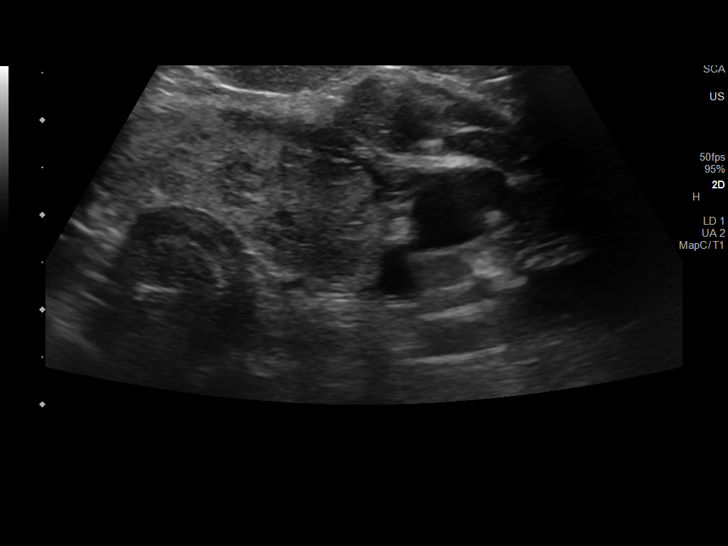
[im 21/31]
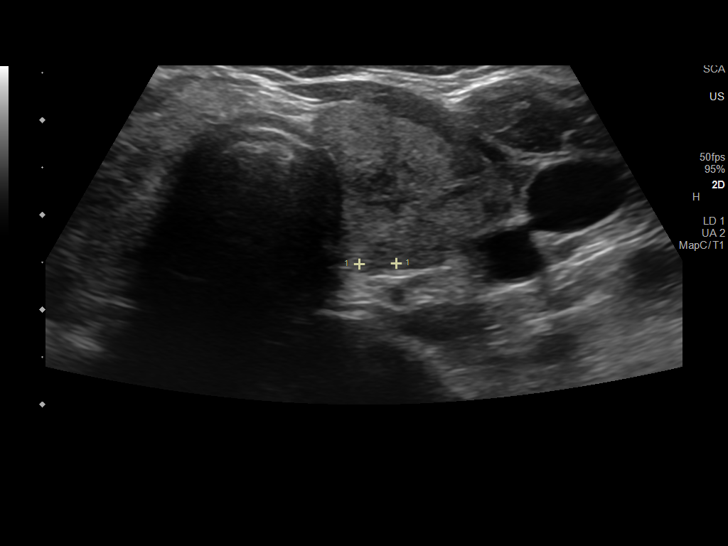
[im 23/31]
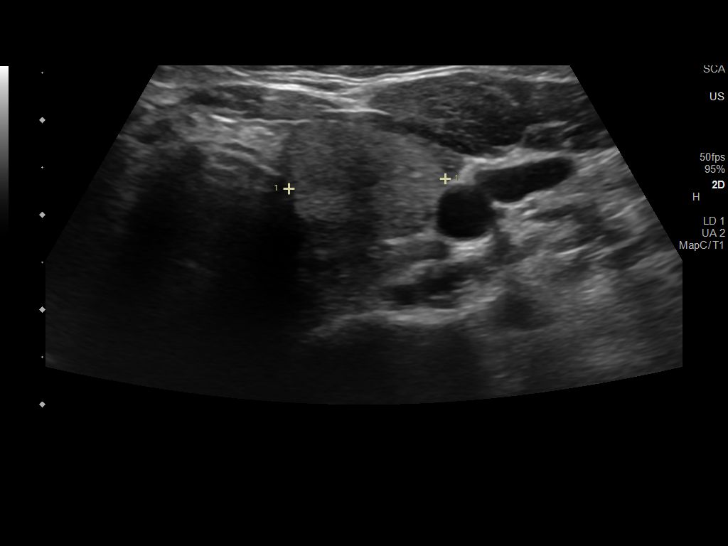
[im 26/31]
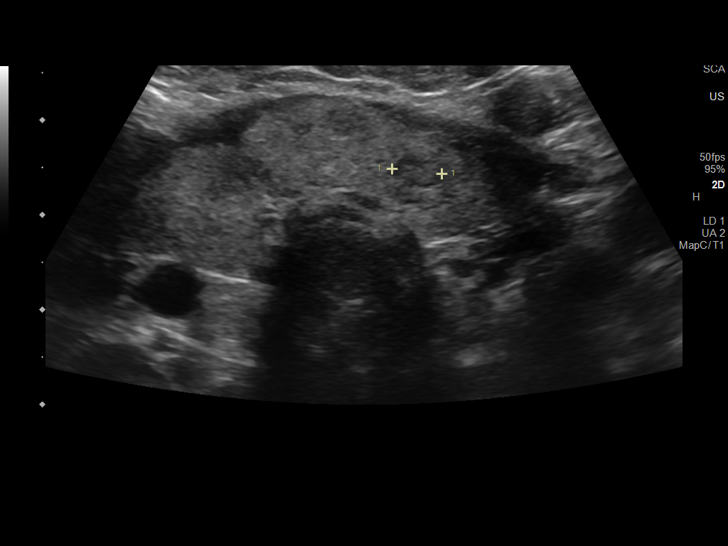
[im 28/31]
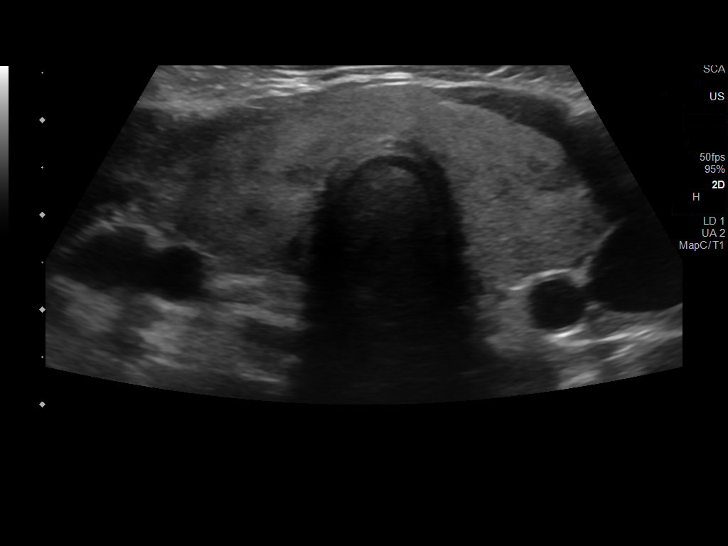
[im 31/31]
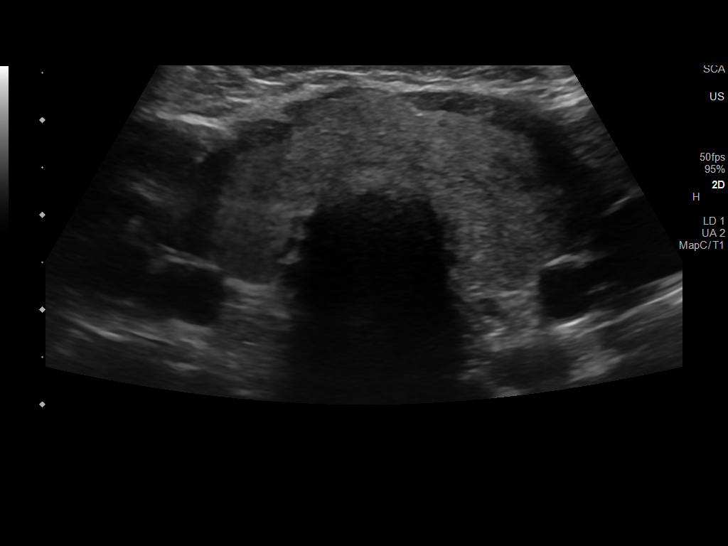

[13 of 25 positions shown; findings below may reference images not displayed]

FINDINGS: Parenchymal Echotexture: Moderately heterogenous

Isthmus: 0.8 cm thickness

Right lobe:  4.3 x 1.5 x 1.6 cm

Left lobe: 4.5 x 1.8 x 1.7 cm

_________________________________________________________

Estimated total number of nodules >/= 1 cm: 2

Number of spongiform nodules >/=  2 cm not described below (TR1): 0

Number of mixed cystic and solid nodules >/= 1.5 cm not described
below (TR2): 0

_________________________________________________________

Nodule # 1:

Location: Left; Mid posterior

Maximum size: 1.2 cm; Other 2 dimensions: 0.5 x 0.4 cm

Composition: cannot determine (2)

Echogenicity: hypoechoic (2)

Shape: not taller-than-wide (0)

Margins: smooth (0)

Echogenic foci: none (0)

ACR TI-RADS total points: 4.

ACR TI-RADS risk category: TR4 (4-6 points).

ACR TI-RADS recommendations:

*Given size (>/= 1 - 1.4 cm) and appearance, a follow-up ultrasound
in 1 year should be considered based on TI-RADS criteria.

_________________________________________________________

Nodule # 2:

Location: Left; Mid

Maximum size: 1 cm; Other 2 dimensions: 0.4 x 0.6 cm

Composition: cannot determine (2)

Echogenicity: hypoechoic (2)

Shape: not taller-than-wide (0)

Margins: ill-defined (0)

Echogenic foci: none (0)

ACR TI-RADS total points: 4.

ACR TI-RADS risk category: TR4 (4-6 points).

ACR TI-RADS recommendations:

*Given size (>/= 1 - 1.4 cm) and appearance, a follow-up ultrasound
in 1 year should be considered based on TI-RADS criteria.

_________________________________________________________

Nodule # 3: 0.4 cm hyperechoic focus without calcifications, mid
left; This nodule does NOT meet TI-RADS criteria for biopsy or
dedicated follow-up.

Nodule # 4: 0.5 cm hypoechoic nodule without calcifications,
inferior left; This nodule does NOT meet TI-RADS criteria for biopsy
or dedicated follow-up.
IMPRESSION: 1. Normal-sized thyroid with small left nodules. None meets criteria
for biopsy.
2. Recommend annual/biennial ultrasound follow-up of nodules as
above, until stability x5 years confirmed.

The above is in keeping with the ACR TI-RADS recommendations - [HOSPITAL] 9297;[DATE].

## 2021-11-14 ENCOUNTER — Other Ambulatory Visit: Payer: Self-pay | Admitting: Family Medicine

## 2021-11-14 DIAGNOSIS — Z3041 Encounter for surveillance of contraceptive pills: Secondary | ICD-10-CM

## 2022-02-20 ENCOUNTER — Other Ambulatory Visit: Payer: Self-pay | Admitting: Family Medicine

## 2022-02-20 DIAGNOSIS — E785 Hyperlipidemia, unspecified: Secondary | ICD-10-CM

## 2022-03-11 NOTE — Progress Notes (Signed)
[facility] [place], [place], [place] [address] [phone]  Date:  03/19/2022   Name:  Tara Lam   DOB:  Tara Lam 20, 1982   MRN:  [id number]  PCP:  Darreld Mclean, MD    Chief Complaint: Annual Exam (Concerns/ questions: none/Flu shot today: yes/)   History of Present Illness:  Tara Lam is a 42 y.o. very pleasant female patient who presents with the following:  Pt seen today for CPE Last visit with myself about one year ago Married with 4 school age children in their blended family- 51, 13,13,11  Pap done last year- negative, repeat in 3 years Labs one year ago - dyslipidemia  Thyroid US one year ago-  IMPRESSION: 1. Similar appearing multinodular goiter. 2. Solid nodule in the left mid thyroid (labeled 3, previously labeled 1) meets criteria (TI-RADS category 4) for 1 year ultrasound surveillance. This study marks 2 years stability. 3. Spongiform nodule in the left mid thyroid (labeled 1, previously labeled 2) demonstrates interval spongiform development is therefore declared benign (TI-RADS category 2). No additional follow-up is warranted.  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/02/2016   Small stature 02/02/2016    Past Medical History:  Diagnosis Date   Chicken pox     Past Surgical History:  Procedure Laterality Date   CESAREAN SECTION  04/03/2010   TONSILLECTOMY     WISDOM TOOTH EXTRACTION      Social History   Tobacco Use   Smoking status: Never   Smokeless tobacco: Never  Substance Use  Topics   Alcohol use: Yes    Comment: social drinker   Drug use: No    Family History  Problem Relation Age of Onset   Cancer Maternal Grandmother    Diabetes Maternal Grandmother    Bladder Cancer Maternal Grandfather    Breast cancer Paternal Grandmother    Cancer Paternal Grandfather     No Known Allergies  Medication list has been reviewed and updated.  Current Outpatient Medications on File Prior to Visit  Medication Sig Dispense Refill   HAILEY 24 FE 1-20 MG-MCG(24) tablet TAKE ONE DAILY, SKIP PLACEBO 84 tablet 3   simvastatin (ZOCOR) 10 MG tablet TAKE 1 TABLET BY MOUTH EVERYDAY AT BEDTIME 90 tablet 1   No current facility-administered medications on file prior to visit.    Review of Systems:  As per HPI- otherwise negative.   Physical Examination: Vitals:   03/19/22 0813  BP: 110/70  Pulse: 62  Resp: 18  Temp: 97.6 F (36.4 C)  SpO2: 99%   Vitals:   03/19/22 0813  Weight: 129 lb (58.5 kg)  Height: '4\' 7"'$  (1.397 m)   Body mass index is 29.98 kg/m. Ideal Body Weight: Weight in (lb) to have BMI = 25: 107.3  GEN: no acute distress. Overweight, looks well  HEENT: Atraumatic, Normocephalic.  Bilateral TM wnl, oropharynx normal.  PEERL,EOMI.   Ears and Nose: No external deformity. CV: RRR, No M/G/R. No JVD. No thrill. No extra heart sounds. PULM: CTA  B, no wheezes, crackles, rhonchi. No retractions. No resp. distress. No accessory muscle use. ABD: S, NT, ND. No rebound. No HSM. EXTR: No c/c/e PSYCH: Normally interactive. Conversant.   Waist measurement 30 in Assessment and Plan: Physical exam  Dyslipidemia - Plan: Lipid panel  Screening for diabetes mellitus - Plan: Comprehensive metabolic panel, Hemoglobin A1c  Screening for deficiency anemia - Plan: CBC  Multiple thyroid nodules - Plan: TSH, US THYROID  Physical exam- recommended healthy diet and exercise routine  Will plan further follow- up pending labs. Recheck thyroid US  She is working  on her weight Plan for routine mammo at age 80   [place], MD  Received labs as below- message to pt  Results for orders placed or performed in visit on 03/19/22  CBC  Result Value Ref Range   WBC 10.1 4.0 - 10.5 K/uL   RBC 4.63 3.87 - 5.11 Mil/uL   Platelets 284.0 150.0 - 400.0 K/uL   Hemoglobin 13.5 12.0 - 15.0 g/dL   HCT 40.3 36.0 - 46.0 %   MCV 87.2 78.0 - 100.0 fl   MCHC 33.5 30.0 - 36.0 g/dL   RDW 13.5 11.5 - 15.5 %  Comprehensive metabolic panel  Result Value Ref Range   Sodium 139 135 - 145 mEq/L   Potassium 3.9 3.5 - 5.1 mEq/L   Chloride 106 96 - 112 mEq/L   CO2 24 19 - 32 mEq/L   Glucose, Bld 87 70 - 99 mg/dL   BUN 17 6 - 23 mg/dL   Creatinine, Ser 0.78 0.40 - 1.20 mg/dL   Total Bilirubin 0.5 0.2 - 1.2 mg/dL   Alkaline Phosphatase 67 39 - 117 U/L   AST 14 0 - 37 U/L   ALT 16 0 - 35 U/L   Total Protein 7.1 6.0 - 8.3 g/dL   Albumin 4.2 3.5 - 5.2 g/dL   GFR 95.61 >60.00 mL/min   Calcium 9.3 8.4 - 10.5 mg/dL  Hemoglobin A1c  Result Value Ref Range   Hgb A1c MFr Bld 5.6 4.6 - 6.5 %  Lipid panel  Result Value Ref Range   Cholesterol 216 (H) 0 - 200 mg/dL   Triglycerides 107.0 0.0 - 149.0 mg/dL   HDL 35.30 (L) >39.00 mg/dL   VLDL 21.4 0.0 - 40.0 mg/dL   LDL Cholesterol 159 (H) 0 - 99 mg/dL   Total CHOL/HDL Ratio 6    NonHDL 180.55   TSH  Result Value Ref Range   TSH 2.65 0.35 - 5.50 uIU/mL

## 2022-03-11 NOTE — Patient Instructions (Signed)
Good to see you again today, I will be in touch with your labs Recommend covid booster if not done

## 2022-03-19 ENCOUNTER — Encounter: Payer: Self-pay | Admitting: Family Medicine

## 2022-03-19 ENCOUNTER — Ambulatory Visit (INDEPENDENT_AMBULATORY_CARE_PROVIDER_SITE_OTHER): Payer: BC Managed Care – PPO | Admitting: Family Medicine

## 2022-03-19 ENCOUNTER — Ambulatory Visit (HOSPITAL_BASED_OUTPATIENT_CLINIC_OR_DEPARTMENT_OTHER)
Admission: RE | Admit: 2022-03-19 | Discharge: 2022-03-19 | Disposition: A | Discharge status: Home / Self Care | Payer: BC Managed Care – PPO | Source: Ambulatory Visit | Attending: Family Medicine | Admitting: Family Medicine

## 2022-03-19 VITALS — BP 110/70 | HR 62 | Temp 97.6°F | Resp 18 | Ht <= 58 in | Wt 129.0 lb

## 2022-03-19 DIAGNOSIS — Z130 Encounter for screening for diseases of the blood and blood-forming organs and certain disorders involving the immune mechanism: Secondary | ICD-10-CM | POA: Diagnosis not present

## 2022-03-19 DIAGNOSIS — Z0000 Encounter for general adult medical examination without abnormal findings: Secondary | ICD-10-CM | POA: Diagnosis not present

## 2022-03-19 DIAGNOSIS — Z131 Encounter for screening for diabetes mellitus: Secondary | ICD-10-CM | POA: Diagnosis not present

## 2022-03-19 DIAGNOSIS — Z23 Encounter for immunization: Secondary | ICD-10-CM | POA: Diagnosis not present

## 2022-03-19 DIAGNOSIS — E785 Hyperlipidemia, unspecified: Secondary | ICD-10-CM | POA: Diagnosis not present

## 2022-03-19 DIAGNOSIS — E042 Nontoxic multinodular goiter: Secondary | ICD-10-CM

## 2022-03-19 LAB — COMPREHENSIVE METABOLIC PANEL
ALT: 16 U/L (ref 0–35)
AST: 14 U/L (ref 0–37)
Albumin: 4.2 g/dL (ref 3.5–5.2)
Alkaline Phosphatase: 67 U/L (ref 39–117)
BUN: 17 mg/dL (ref 6–23)
CO2: 24 mEq/L (ref 19–32)
Calcium: 9.3 mg/dL (ref 8.4–10.5)
Chloride: 106 mEq/L (ref 96–112)
Creatinine, Ser: 0.78 mg/dL (ref 0.40–1.20)
GFR: 95.61 mL/min (ref >60.00)
Glucose, Bld: 87 mg/dL (ref 70–99)
Potassium: 3.9 mEq/L (ref 3.5–5.1)
Sodium: 139 mEq/L (ref 135–145)
Total Bilirubin: 0.5 mg/dL (ref 0.2–1.2)
Total Protein: 7.1 g/dL (ref 6.0–8.3)

## 2022-03-19 LAB — TSH: TSH: 2.65 u[IU]/mL (ref 0.35–5.50)

## 2022-03-19 LAB — CBC
HCT: 40.3 % (ref 36.0–46.0)
Hemoglobin: 13.5 g/dL (ref 12.0–15.0)
MCHC: 33.5 g/dL (ref 30.0–36.0)
MCV: 87.2 fl (ref 78.0–100.0)
Platelets: 284 10*3/uL (ref 150.0–400.0)
RBC: 4.63 Mil/uL (ref 3.87–5.11)
RDW: 13.5 % (ref 11.5–15.5)
WBC: 10.1 10*3/uL (ref 4.0–10.5)

## 2022-03-19 LAB — HEMOGLOBIN A1C: Hgb A1c MFr Bld: 5.6 % (ref 4.6–6.5)

## 2022-03-19 LAB — LIPID PANEL
Cholesterol: 216 mg/dL — ABNORMAL HIGH (ref 0–200)
HDL: 35.3 mg/dL — ABNORMAL LOW (ref >39.00)
LDL Cholesterol: 159 mg/dL — ABNORMAL HIGH (ref 0–99)
NonHDL: 180.55
Total CHOL/HDL Ratio: 6
Triglycerides: 107 mg/dL (ref 0.0–149.0)
VLDL: 21.4 mg/dL (ref 0.0–40.0)

## 2022-03-26 ENCOUNTER — Encounter: Payer: Self-pay | Admitting: Family Medicine

## 2022-07-30 ENCOUNTER — Other Ambulatory Visit: Payer: Self-pay | Admitting: Family Medicine

## 2022-07-30 DIAGNOSIS — Z1231 Encounter for screening mammogram for malignant neoplasm of breast: Secondary | ICD-10-CM

## 2022-09-17 ENCOUNTER — Ambulatory Visit
Admission: RE | Admit: 2022-09-17 | Discharge: 2022-09-17 | Disposition: A | Discharge status: Home / Self Care | Payer: BC Managed Care – PPO | Source: Ambulatory Visit | Attending: Family Medicine | Admitting: Family Medicine

## 2022-09-17 DIAGNOSIS — Z1231 Encounter for screening mammogram for malignant neoplasm of breast: Secondary | ICD-10-CM

## 2023-02-20 ENCOUNTER — Encounter (INDEPENDENT_AMBULATORY_CARE_PROVIDER_SITE_OTHER): Payer: BC Managed Care – PPO | Admitting: Family Medicine

## 2023-02-20 ENCOUNTER — Telehealth: Payer: Self-pay

## 2023-02-20 DIAGNOSIS — Z20828 Contact with and (suspected) exposure to other viral communicable diseases: Secondary | ICD-10-CM

## 2023-02-20 DIAGNOSIS — Z7721 Contact with and (suspected) exposure to potentially hazardous body fluids: Secondary | ICD-10-CM

## 2023-02-20 MED ORDER — OSELTAMIVIR PHOSPHATE 75 MG PO CAPS: 75.0000 mg | ORAL_CAPSULE | Freq: Every day | ORAL | 0 refills | Status: DC

## 2023-02-20 NOTE — Telephone Encounter (Signed)
Mychart message sent by Pt.

## 2023-02-20 NOTE — Telephone Encounter (Signed)
Copied from CRM [id number]. Topic: Clinical - Medical Advice >> Feb 20, 2023 11:53 AM Pascal Lux wrote: Reason for CRM: Patient called and is requesting a call from her provider regarding her wanting a medication she can take because her daughter was recently diagnosed with the flu.

## 2023-02-20 NOTE — Telephone Encounter (Signed)
Lam, Tara L  CR   02/20/23  1:52 PM Unsigned Note Copied from CRM [id number]. Topic: Clinical - Medical Advice >> Feb 20, 2023 11:53 AM Pascal Lux wrote: Reason for CRM: Patient called and is requesting a call from her provider regarding her wanting a medication she can take because her daughter was recently diagnosed with the flu.

## 2023-02-20 NOTE — Addendum Note (Signed)
Addended by: Abbe Amsterdam C on: 02/20/2023 03:50 PM   Modules accepted: Orders

## 2023-02-20 NOTE — Telephone Encounter (Signed)
Opened new encounter

## 2023-02-20 NOTE — Telephone Encounter (Signed)

## 2023-03-17 NOTE — Patient Instructions (Addendum)
 Good to see you today!  Recommend covid booster if none in the last 6 months or so  Flu shot today Work on gradual weight loss with diet and exercise changes  I will be in touch with your labs

## 2023-03-17 NOTE — Progress Notes (Signed)
 [facility] [address], [address] [place], [place] [address] [phone]  Date:  03/25/2023   Name:  Tara Lam   DOB:  06/26/1980   MRN:  [id number]  PCP:  Pearline Cables, MD    Chief Complaint: Annual Exam (Concerns/ questions: none/Flu shot today: none yet, she will update this if it not too late/)   History of Present Illness:  Tara Lam is a 43 y.o. very pleasant female patient who presents with the following:  Pt seen today for a CPE Last seen by myself about one year ago  Married with 4 school age children in their blended family- 84, 109, 40, 15- the oldest is driving  Flu vaccine; give today  Pap- UTD, negative 2023 Labs - can update  Mammo is UTD  Can stop thyroid US surveillance if she would like, or can repeat US   Wt Readings from Last 3 Encounters:  03/25/23 132 lb 12.8 oz (60.2 kg)  03/19/22 129 lb (58.5 kg)  03/15/21 125 lb (56.7 kg)   She has not been following her diet as closely as she would like  She is trying to exercise by walking at work  No tobacco, sometimes alcohol   She is s/p BTL and her husband has a vasectomy - she came off her OCP and is having menses again  At this point she does not want to go back on OCP  No CP or SOB with exercise No family history of colon cancer  Patient Active Problem List   Diagnosis Date Noted   Multiple thyroid nodules 03/15/2021   Gastroesophageal reflux disease without esophagitis 12/02/2020   Prediabetes 03/11/2019   Dyslipidemia 03/11/2019   Mild hearing loss of right ear 02/02/2016   Small stature 02/02/2016    Past Medical History:  Diagnosis Date   Chicken pox     Past Surgical History:  Procedure Laterality Date   CESAREAN SECTION  04/03/2010   TONSILLECTOMY     WISDOM TOOTH EXTRACTION      Social History   Tobacco Use   Smoking status: Never   Smokeless tobacco: Never  Substance Use Topics   Alcohol use: Yes     Comment: social drinker   Drug use: No    Family History  Problem Relation Age of Onset   Cancer Maternal Grandmother    Diabetes Maternal Grandmother    Bladder Cancer Maternal Grandfather    Breast cancer Paternal Grandmother    Cancer Paternal Grandfather     No Known Allergies  Medication list has been reviewed and updated.  No current outpatient medications on file prior to visit.   No current facility-administered medications on file prior to visit.    Review of Systems:  As per HPI- otherwise negative.   Physical Examination: Vitals:   03/25/23 0815  BP: 122/80  Pulse: 60  Resp: 18  Temp: 97.7 F (36.5 C)  SpO2: 99%   Vitals:   03/25/23 0815  Weight: 132 lb 12.8 oz (60.2 kg)  Height: 4\' 7"  (1.397 m)   Body mass index is 30.87 kg/m. Ideal Body Weight: Weight in (lb) to have BMI = 25: 107.3  GEN: no acute distress. Mild obesity, very petite build HEENT: Atraumatic, Normocephalic.  Bilateral TM wnl, oropharynx normal.  PEERL,EOMI.  Pt has a skin freckle on her left lower lid stable since birth  Ears and Nose: No external deformity. CV: RRR, No M/G/R. No  JVD. No thrill. No extra heart sounds. PULM: CTA B, no wheezes, crackles, rhonchi. No retractions. No resp. distress. No accessory muscle use. ABD: S, NT, ND. No rebound. No HSM. EXTR: No c/c/e PSYCH: Normally interactive. Conversant.    Assessment and Plan: Physical exam  Screening for diabetes mellitus - Plan: Comprehensive metabolic panel, Hemoglobin A1c  Dyslipidemia - Plan: Lipid panel  Screening for deficiency anemia - Plan: CBC  Vitamin D deficiency - Plan: VITAMIN D 25 Hydroxy (Vit-D Deficiency, Fractures)  Thyroid disorder screening - Plan: TSH  Physical exam today- recommend healthy diet and exercise routine Will plan further follow- up pending labs. Flu shot today She is UTD on pap and mammo Plan colon at age 53 Work on weight loss   Signed Abbe Amsterdam, MD  Received  labs, completed form and sent message to pt  Results for orders placed or performed in visit on 03/25/23  CBC   Collection Time: 03/25/23  8:42 AM  Result Value Ref Range   WBC 9.1 4.0 - 10.5 K/uL   RBC 4.26 3.87 - 5.11 Mil/uL   Platelets 340.0 150.0 - 400.0 K/uL   Hemoglobin 12.0 12.0 - 15.0 g/dL   HCT 40.9 81.1 - 91.4 %   MCV 85.4 78.0 - 100.0 fl   MCHC 32.9 30.0 - 36.0 g/dL   RDW 78.2 95.6 - 21.3 %  Comprehensive metabolic panel   Collection Time: 03/25/23  8:42 AM  Result Value Ref Range   Sodium 140 135 - 145 mEq/L   Potassium 4.1 3.5 - 5.1 mEq/L   Chloride 107 96 - 112 mEq/L   CO2 26 19 - 32 mEq/L   Glucose, Bld 95 70 - 99 mg/dL   BUN 16 6 - 23 mg/dL   Creatinine, Ser 0.86 0.40 - 1.20 mg/dL   Total Bilirubin 0.3 0.2 - 1.2 mg/dL   Alkaline Phosphatase 72 39 - 117 U/L   AST 14 0 - 37 U/L   ALT 13 0 - 35 U/L   Total Protein 6.9 6.0 - 8.3 g/dL   Albumin 4.1 3.5 - 5.2 g/dL   GFR 578.46 >96.29 mL/min   Calcium 9.0 8.4 - 10.5 mg/dL  Hemoglobin B2W   Collection Time: 03/25/23  8:42 AM  Result Value Ref Range   Hgb A1c MFr Bld 5.8 4.6 - 6.5 %  Lipid panel   Collection Time: 03/25/23  8:42 AM  Result Value Ref Range   Cholesterol 200 0 - 200 mg/dL   Triglycerides 41.3 0.0 - 149.0 mg/dL   HDL 24.40 (L) >10.27 mg/dL   VLDL 25.3 0.0 - 66.4 mg/dL   LDL Cholesterol 403 (H) 0 - 99 mg/dL   Total CHOL/HDL Ratio 5    NonHDL 163.20   TSH   Collection Time: 03/25/23  8:42 AM  Result Value Ref Range   TSH 1.75 0.35 - 5.50 uIU/mL  VITAMIN D 25 Hydroxy (Vit-D Deficiency, Fractures)   Collection Time: 03/25/23  8:42 AM  Result Value Ref Range   VITD 21.65 (L) 30.00 - 100.00 ng/mL

## 2023-03-25 ENCOUNTER — Encounter: Payer: Self-pay | Admitting: Family Medicine

## 2023-03-25 ENCOUNTER — Ambulatory Visit (INDEPENDENT_AMBULATORY_CARE_PROVIDER_SITE_OTHER): Payer: BC Managed Care – PPO | Admitting: Family Medicine

## 2023-03-25 VITALS — BP 122/80 | HR 60 | Temp 97.7°F | Resp 18 | Ht <= 58 in | Wt 132.8 lb

## 2023-03-25 DIAGNOSIS — Z23 Encounter for immunization: Secondary | ICD-10-CM

## 2023-03-25 DIAGNOSIS — Z1329 Encounter for screening for other suspected endocrine disorder: Secondary | ICD-10-CM

## 2023-03-25 DIAGNOSIS — E559 Vitamin D deficiency, unspecified: Secondary | ICD-10-CM

## 2023-03-25 DIAGNOSIS — Z0000 Encounter for general adult medical examination without abnormal findings: Secondary | ICD-10-CM | POA: Diagnosis not present

## 2023-03-25 DIAGNOSIS — Z131 Encounter for screening for diabetes mellitus: Secondary | ICD-10-CM | POA: Diagnosis not present

## 2023-03-25 DIAGNOSIS — Z130 Encounter for screening for diseases of the blood and blood-forming organs and certain disorders involving the immune mechanism: Secondary | ICD-10-CM | POA: Diagnosis not present

## 2023-03-25 DIAGNOSIS — E785 Hyperlipidemia, unspecified: Secondary | ICD-10-CM | POA: Diagnosis not present

## 2023-03-25 LAB — COMPREHENSIVE METABOLIC PANEL
ALT: 13 U/L (ref 0–35)
AST: 14 U/L (ref 0–37)
Albumin: 4.1 g/dL (ref 3.5–5.2)
Alkaline Phosphatase: 72 U/L (ref 39–117)
BUN: 16 mg/dL (ref 6–23)
CO2: 26 meq/L (ref 19–32)
Calcium: 9 mg/dL (ref 8.4–10.5)
Chloride: 107 meq/L (ref 96–112)
Creatinine, Ser: 0.68 mg/dL (ref 0.40–1.20)
GFR: 108.86 mL/min (ref >60.00)
Glucose, Bld: 95 mg/dL (ref 70–99)
Potassium: 4.1 meq/L (ref 3.5–5.1)
Sodium: 140 meq/L (ref 135–145)
Total Bilirubin: 0.3 mg/dL (ref 0.2–1.2)
Total Protein: 6.9 g/dL (ref 6.0–8.3)

## 2023-03-25 LAB — VITAMIN D 25 HYDROXY (VIT D DEFICIENCY, FRACTURES): VITD: 21.65 ng/mL — ABNORMAL LOW (ref 30.00–100.00)

## 2023-03-25 LAB — CBC
HCT: 36.4 % (ref 36.0–46.0)
Hemoglobin: 12 g/dL (ref 12.0–15.0)
MCHC: 32.9 g/dL (ref 30.0–36.0)
MCV: 85.4 fl (ref 78.0–100.0)
Platelets: 340 10*3/uL (ref 150.0–400.0)
RBC: 4.26 Mil/uL (ref 3.87–5.11)
RDW: 14.2 % (ref 11.5–15.5)
WBC: 9.1 10*3/uL (ref 4.0–10.5)

## 2023-03-25 LAB — TSH: TSH: 1.75 u[IU]/mL (ref 0.35–5.50)

## 2023-03-25 LAB — LIPID PANEL
Cholesterol: 200 mg/dL (ref 0–200)
HDL: 36.7 mg/dL — ABNORMAL LOW (ref >39.00)
LDL Cholesterol: 148 mg/dL — ABNORMAL HIGH (ref 0–99)
NonHDL: 163.2
Total CHOL/HDL Ratio: 5
Triglycerides: 77 mg/dL (ref 0.0–149.0)
VLDL: 15.4 mg/dL (ref 0.0–40.0)

## 2023-03-25 LAB — HEMOGLOBIN A1C: Hgb A1c MFr Bld: 5.8 % (ref 4.6–6.5)

## 2023-08-29 ENCOUNTER — Other Ambulatory Visit: Payer: Self-pay | Admitting: Family Medicine

## 2023-08-29 DIAGNOSIS — Z1231 Encounter for screening mammogram for malignant neoplasm of breast: Secondary | ICD-10-CM

## 2023-09-18 ENCOUNTER — Ambulatory Visit
Admission: RE | Admit: 2023-09-18 | Discharge: 2023-09-18 | Disposition: A | Discharge status: Home / Self Care | Source: Ambulatory Visit | Attending: Family Medicine | Admitting: Family Medicine

## 2023-09-18 DIAGNOSIS — Z1231 Encounter for screening mammogram for malignant neoplasm of breast: Secondary | ICD-10-CM

## 2023-09-25 ENCOUNTER — Other Ambulatory Visit: Payer: Self-pay | Admitting: Family Medicine

## 2023-09-25 DIAGNOSIS — R928 Other abnormal and inconclusive findings on diagnostic imaging of breast: Secondary | ICD-10-CM

## 2023-10-01 ENCOUNTER — Ambulatory Visit
Admission: RE | Admit: 2023-10-01 | Discharge: 2023-10-01 | Disposition: A | Discharge status: Home / Self Care | Source: Ambulatory Visit | Attending: Family Medicine | Admitting: Family Medicine

## 2023-10-01 DIAGNOSIS — R928 Other abnormal and inconclusive findings on diagnostic imaging of breast: Secondary | ICD-10-CM

## 2023-10-13 IMAGING — US US THYROID
1 series · 12 of 25 positions shown · non-contrast
Comparison: 03/16/2019

CLINICAL DATA: Prior ultrasound follow-up.

EXAM:
THYROID ULTRASOUND
TECHNIQUE: Ultrasound examination of the thyroid gland and adjacent soft
tissues was performed.

[Series 1: us thyroid · 12 of 56 slices shown]
[im 3/56]
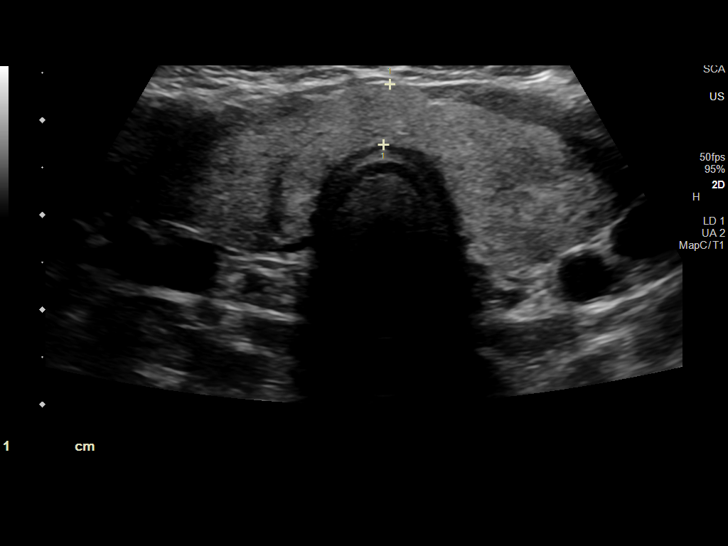
[im 7/56]
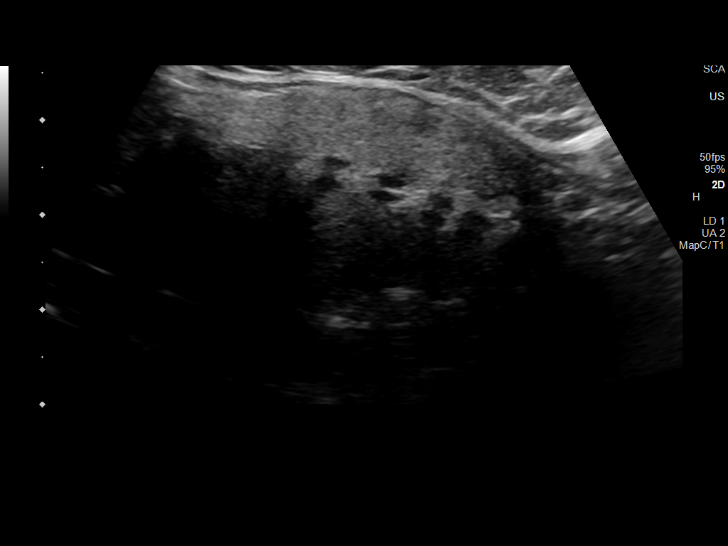
[im 12/56]
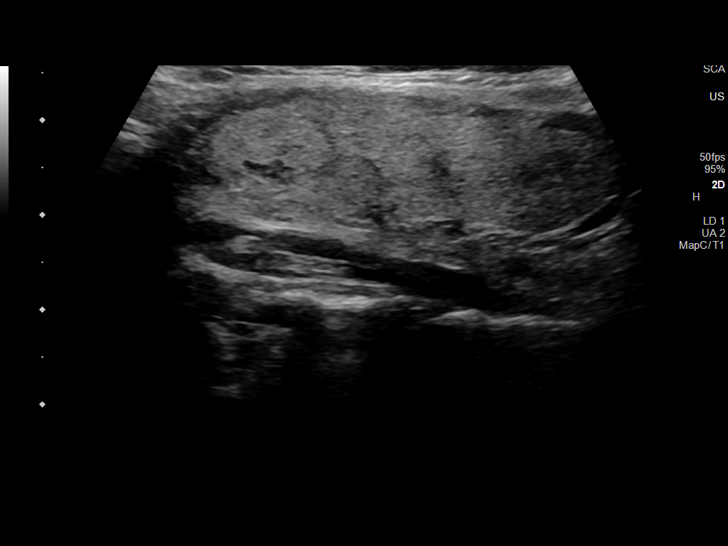
[im 17/56]
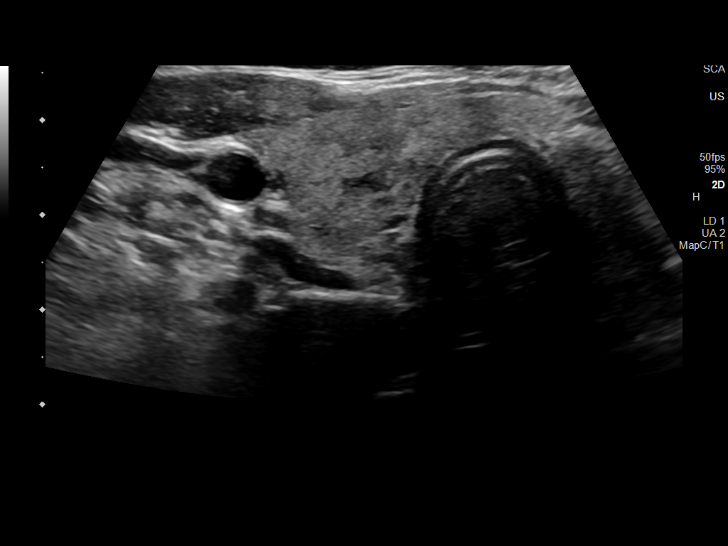
[im 21/56]
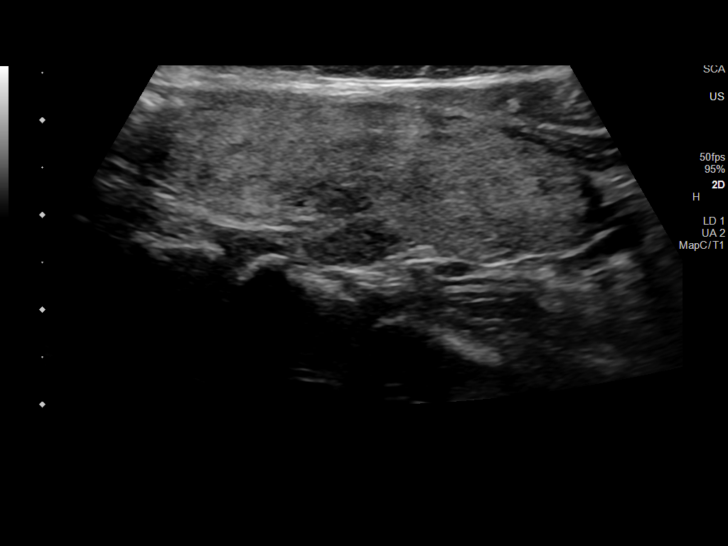
[im 26/56]
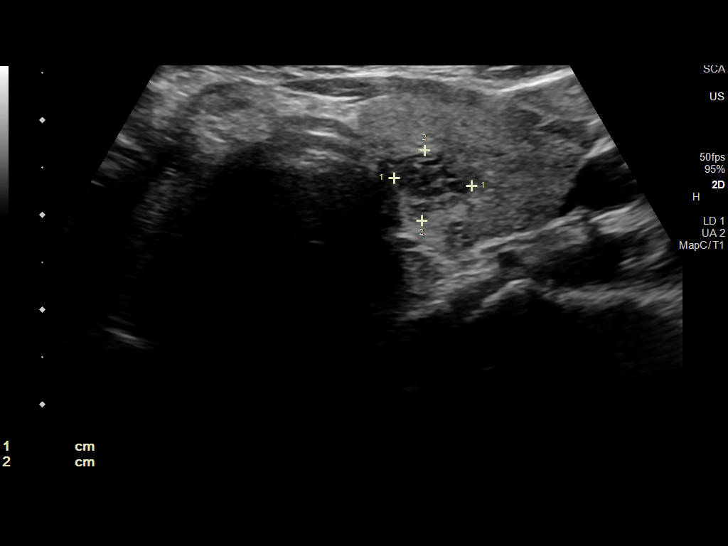
[im 30/56]
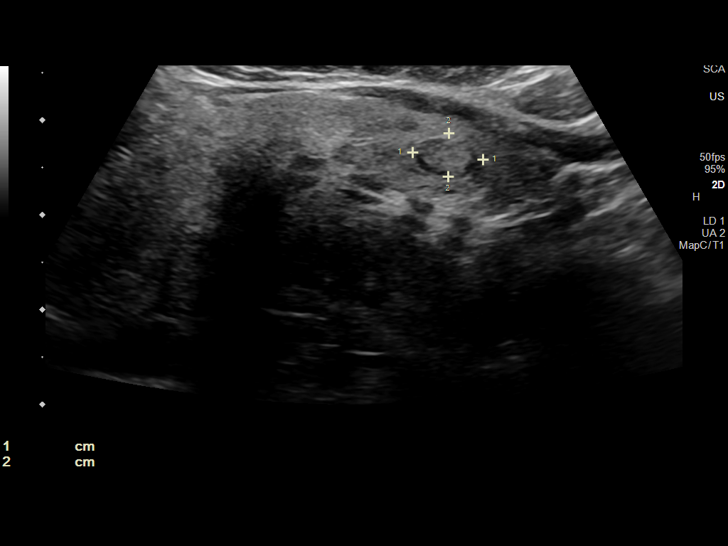
[im 35/56]
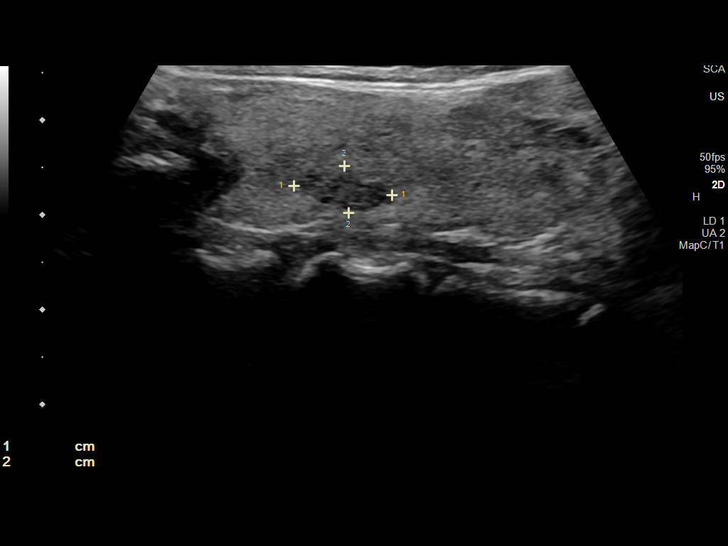
[im 39/56]
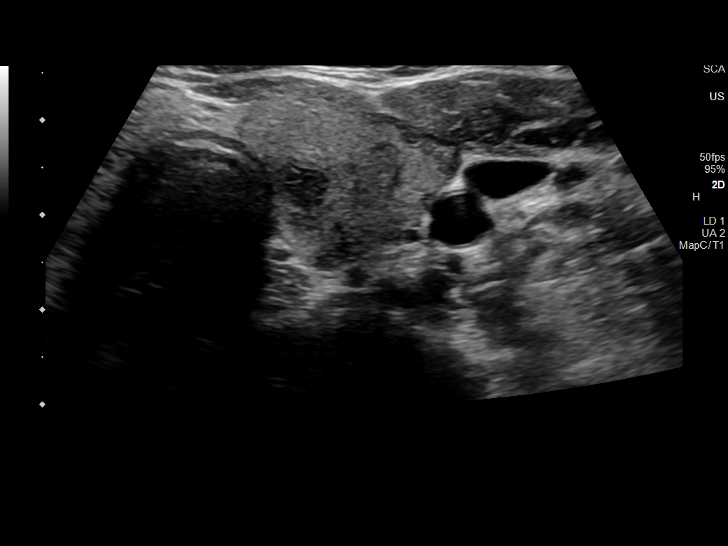
[im 44/56]
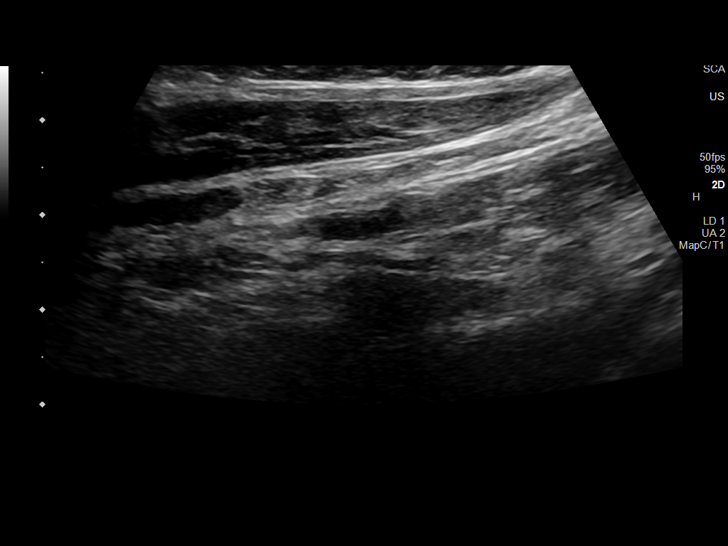
[im 49/56]
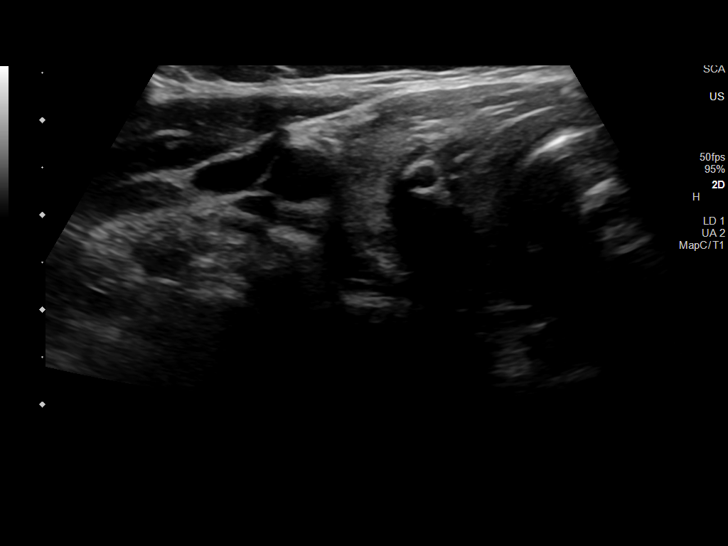
[im 53/56]
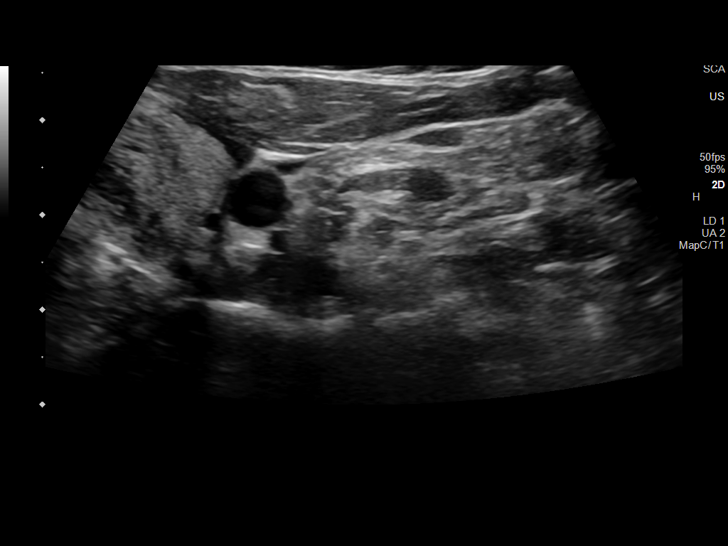

[12 of 25 positions shown; findings below may reference images not displayed]

FINDINGS: Parenchymal Echotexture: Moderately heterogenous

Isthmus: 0.6 cm, previously 0.8 cm

Right lobe: 4.8 x 1.4 x 1.7 cm, previously 4.3 x 1.5 x 1.6 cm

Left lobe: 4.8 x 1.8 x 2.0 cm, previously 4.5 x 1.8 x 1.7 cm

_________________________________________________________

Estimated total number of nodules >/= 1 cm: 1

Number of spongiform nodules >/=  2 cm not described below (TR1): 0

Number of mixed cystic and solid nodules >/= 1.5 cm not described
below (TR2): 0

_________________________________________________________

Nodule # 1, previously labeled 2:

Prior biopsy: No

Location: Left; Mid

Maximum size: 0.8 cm; Other 2 dimensions: 0.8 x 0.8 cm, previously,
1.0 x 0.4 x 0.6 cm

Composition: spongiform (0)

Echogenicity: hypoechoic (2)

Shape: not taller-than-wide (0)

Margins: ill-defined (0)

Echogenic foci: none (0)

ACR TI-RADS total points: 2.

ACR TI-RADS risk category:  TR2 (2 points).

Significant change in size (>/= 20% in two dimensions and minimal
increase of 2 mm): No

Change in features: Yes

Change in ACR TI-RADS risk category: Yes

ACR TI-RADS recommendations:

This nodule does NOT meet TI-RADS criteria for biopsy or dedicated
follow-up.

_________________________________________________________

Nodule # 3, previously labeled 1:

Prior biopsy: No

Location: Left; Mid

Maximum size: 1.0 cm; Other 2 dimensions: 0.5 x 0.6 cm, previously,
1.2 x 0.5 x 0.4 cm

Composition: solid/almost completely solid (2)

Echogenicity: hypoechoic (2)

Shape: not taller-than-wide (0)

Margins: ill-defined (0)

Echogenic foci: none (0)

ACR TI-RADS total points: 4.

ACR TI-RADS risk category:  TR4 (4-6 points).

Significant change in size (>/= 20% in two dimensions and minimal
increase of 2 mm): No

Change in features: No

Change in ACR TI-RADS risk category: No

ACR TI-RADS recommendations:

*Given size (>/= 1 - 1.4 cm) and appearance, a follow-up ultrasound
in 1 year should be considered based on TI-RADS criteria.

_________________________________________________________

Nodule # 4:

Prior biopsy: No

Location: Left; Inferior

Maximum size: 0.7 cm; Other 2 dimensions: 0.5 x 0.4 cm, previously,
0.4 x 0.30.5 cm

Composition: solid/almost completely solid (2)

Echogenicity: hyperechoic (1)

Shape: not taller-than-wide (0)

Margins: smooth (0)

Echogenic foci: none (0)

ACR TI-RADS total points: 3.

ACR TI-RADS risk category:  TR3 (3 points).

Significant change in size (>/= 20% in two dimensions and minimal
increase of 2 mm): No

Change in features: No

Change in ACR TI-RADS risk category: No

ACR TI-RADS recommendations:

Given size (<1.4 cm) and appearance, this nodule does NOT meet
TI-RADS criteria for biopsy or dedicated follow-up.

_________________________________________________________

No cervical lymphadenopathy.
IMPRESSION: 1. Similar appearing multinodular goiter.
2. Solid nodule in the left mid thyroid (labeled 3, previously
labeled 1) meets criteria (TI-RADS category 4) for 1 year ultrasound
surveillance. This study marks 2 years stability.
3. Spongiform nodule in the left mid thyroid (labeled 1, previously
labeled 2) demonstrates interval spongiform development is therefore
declared benign (TI-RADS category 2). No additional follow-up is
warranted.

The above is in keeping with the ACR TI-RADS recommendations - [HOSPITAL] 5151;[DATE].

## 2024-01-29 ENCOUNTER — Encounter: Payer: Self-pay | Admitting: Family Medicine

## 2024-03-25 ENCOUNTER — Encounter: Admitting: Family Medicine
